# Patient Record
Sex: Male | Born: 1950
Health system: Southern US, Community
[De-identification: ages and names within clinical notes are randomized; demographics above are authoritative.]

## PROBLEM LIST (undated history)

## (undated) DIAGNOSIS — I10 Essential (primary) hypertension: Secondary | ICD-10-CM

## (undated) DIAGNOSIS — E7800 Pure hypercholesterolemia, unspecified: Secondary | ICD-10-CM

---

## 2009-12-26 ENCOUNTER — Ambulatory Visit: Payer: Self-pay | Admitting: Gastroenterology

## 2014-09-18 ENCOUNTER — Ambulatory Visit: Payer: Self-pay | Admitting: Gastroenterology

## 2014-11-20 LAB — SURGICAL PATHOLOGY

## 2016-10-03 ENCOUNTER — Emergency Department: Payer: Medicare Other

## 2016-10-03 ENCOUNTER — Encounter: Payer: Self-pay | Admitting: *Deleted

## 2016-10-03 DIAGNOSIS — I10 Essential (primary) hypertension: Secondary | ICD-10-CM | POA: Diagnosis not present

## 2016-10-03 DIAGNOSIS — R0789 Other chest pain: Secondary | ICD-10-CM | POA: Insufficient documentation

## 2016-10-03 DIAGNOSIS — R0781 Pleurodynia: Secondary | ICD-10-CM | POA: Diagnosis present

## 2016-10-03 LAB — BASIC METABOLIC PANEL
Anion gap: 8 (ref 5–15)
BUN: 12 mg/dL (ref 6–20)
CALCIUM: 9.7 mg/dL (ref 8.9–10.3)
CO2: 31 mmol/L (ref 22–32)
CREATININE: 0.92 mg/dL (ref 0.61–1.24)
Chloride: 97 mmol/L — ABNORMAL LOW (ref 101–111)
Glucose, Bld: 147 mg/dL — ABNORMAL HIGH (ref 65–99)
Potassium: 3.9 mmol/L (ref 3.5–5.1)
SODIUM: 136 mmol/L (ref 135–145)

## 2016-10-03 LAB — CBC
HCT: 43.3 % (ref 40.0–52.0)
Hemoglobin: 15.6 g/dL (ref 13.0–18.0)
MCH: 30.3 pg (ref 26.0–34.0)
MCHC: 35.9 g/dL (ref 32.0–36.0)
MCV: 84.2 fL (ref 80.0–100.0)
PLATELETS: 154 10*3/uL (ref 150–440)
RBC: 5.14 MIL/uL (ref 4.40–5.90)
RDW: 13.7 % (ref 11.5–14.5)
WBC: 6.8 10*3/uL (ref 3.8–10.6)

## 2016-10-03 LAB — TROPONIN I

## 2016-10-03 NOTE — ED Triage Notes (Signed)
PT arrived to ED reporting left sided chest pains beginning at 14:00 this afternoon. PT reports having taken Rolaids without relief and increased pain began at 16:00. PT denies SOB, weakness, dizziness, NVD, cough, fever. No increased tenderness upon palpation. Pt denies a heart hx other than HTN,  but reports he has not been to the doctor in 17 years. Pt is alert and oriented x 4 and in NAD at this time.

## 2016-10-04 ENCOUNTER — Emergency Department
Admission: EM | Admit: 2016-10-04 | Discharge: 2016-10-04 | Disposition: A | Discharge status: Home / Self Care | Payer: Medicare Other | Attending: Emergency Medicine | Admitting: Emergency Medicine

## 2016-10-04 DIAGNOSIS — R079 Chest pain, unspecified: Secondary | ICD-10-CM

## 2016-10-04 HISTORY — DX: Essential (primary) hypertension: I10

## 2016-10-04 LAB — HEPATIC FUNCTION PANEL
ALT: 37 U/L (ref 17–63)
AST: 37 U/L (ref 15–41)
Albumin: 4.4 g/dL (ref 3.5–5.0)
Alkaline Phosphatase: 47 U/L (ref 38–126)
BILIRUBIN DIRECT: 0.2 mg/dL (ref 0.1–0.5)
BILIRUBIN TOTAL: 1.2 mg/dL (ref 0.3–1.2)
Indirect Bilirubin: 1 mg/dL — ABNORMAL HIGH (ref 0.3–0.9)
Total Protein: 7.6 g/dL (ref 6.5–8.1)

## 2016-10-04 LAB — LIPASE, BLOOD: Lipase: 16 U/L (ref 11–51)

## 2016-10-04 LAB — TROPONIN I: Troponin I: <0.03 ng/mL (ref <0.03)

## 2016-10-04 MED ORDER — ASPIRIN 81 MG PO CHEW
324.0000 mg | CHEWABLE_TABLET | Freq: Once | ORAL | Status: AC
  Administered 2016-10-04: 324 mg via ORAL
  Filled 2016-10-04: qty 4

## 2016-10-04 NOTE — ED Notes (Signed)
Patient reports intermittent left chest pain beginning at 1400 10/03/16 that has since resolved. Pt denies radiation and accompanying symptoms. Pt reports he believes it may have been indigestion.

## 2016-10-04 NOTE — Discharge Instructions (Signed)
1. Start baby aspirin daily. °2. Return to the ER for worsening symptoms, persistent vomiting, difficulty breathing or other concerns. °

## 2016-10-04 NOTE — ED Provider Notes (Signed)
[facility] Emergency Department Provider Note   ____________________________________________   First MD Initiated Contact with Patient 10/04/16 0144     (approximate)  I have reviewed the triage vital signs and the nursing notes.   HISTORY  Chief Complaint Chest Pain    HPI Gregory Mathis is a 66 y.o. male who presents to the ED from home with a chief complaint of chest pain. Patient reports substernal and left-sided"funny feeling in chest" approximately 2 PM after eating 2 chocolate chip cookies. Pain subsided but then returned approximately 4 PM after patient ate 2 apples. Patient reports taking Rolaids without relief of symptoms. Does report belching a lot. Cannot characterize discomfort other than saying it "feels funny". Denies associated diaphoresis, shortness of breath, nausea, vomiting, palpitations or dizziness. Denies recent fever, chills, cough, congestion, abdominal pain, diarrhea. Denies recent travel, trauma or hormone use.   Past Medical History:  Diagnosis Date  . Hypertension     There are no active problems to display for this patient.   History reviewed. No pertinent surgical history.  Prior to Admission medications   Not on File    Allergies Patient has no allergy information on record.  Family history None for CAD  Social History Social History  Substance Use Topics  . Smoking status: Never Smoker  . Smokeless tobacco: Never Used  . Alcohol use No    Review of Systems  Constitutional: No fever/chills. Eyes: No visual changes. ENT: No sore throat. Cardiovascular: Positive for chest pain. Respiratory: Denies shortness of breath. Gastrointestinal: No abdominal pain.  No nausea, no vomiting.  No diarrhea.  No constipation. Genitourinary: Negative for dysuria. Musculoskeletal: Negative for back pain. Skin: Negative for rash. Neurological: Negative for headaches, focal weakness or numbness.  10-point ROS  otherwise negative.  ____________________________________________   PHYSICAL EXAM:  VITAL SIGNS: ED Triage Vitals  Enc Vitals Group     BP 10/03/16 2135 (!) 160/88     Pulse Rate 10/03/16 2135 85     Resp 10/03/16 2135 16     Temp 10/03/16 2135 97.6 F (36.4 C)     Temp Source 10/03/16 2135 Oral     SpO2 10/03/16 2135 97 %     Weight 10/03/16 2133 185 lb (83.9 kg)     Height 10/03/16 2133 5\' 10"  (1.778 m)     Head Circumference --      Peak Flow --      Pain Score 10/03/16 2133 4     Pain Loc --      Pain Edu? --      Excl. in GC? --     Constitutional: Alert and oriented. Well appearing and in no acute distress. Eyes: Conjunctivae are normal. PERRL. EOMI. Head: Atraumatic. Nose: No congestion/rhinnorhea. Mouth/Throat: Mucous membranes are moist.  Oropharynx non-erythematous. Neck: No stridor.   Cardiovascular: Normal rate, regular rhythm. Grossly normal heart sounds.  Good peripheral circulation. Respiratory: Normal respiratory effort.  No retractions. Lungs CTAB. Gastrointestinal: Soft and nontender. No distention. No abdominal bruits. No CVA tenderness. Musculoskeletal: No lower extremity tenderness nor edema.  No joint effusions. Neurologic:  Normal speech and language. No gross focal neurologic deficits are appreciated. No gait instability. Skin:  Skin is warm, dry and intact. No rash noted. Psychiatric: Mood and affect are normal. Speech and behavior are normal.  ____________________________________________   LABS (all labs ordered are listed, but only abnormal results are displayed)  Labs Reviewed  BASIC METABOLIC PANEL - Abnormal; Notable for the following:  Result Value   Chloride 97 (*)    Glucose, Bld 147 (*)    All other components within normal limits  HEPATIC FUNCTION PANEL - Abnormal; Notable for the following:    Indirect Bilirubin 1.0 (*)    All other components within normal limits  CBC  TROPONIN I  LIPASE, BLOOD  TROPONIN I    ____________________________________________  EKG  ED ECG REPORT I, Jezelle Gullick J, the attending physician, personally viewed and interpreted this ECG.   Date: 10/04/2016  EKG Time: 2136  Rate: 68  Rhythm: normal EKG, normal sinus rhythm  Axis: Normal  Intervals:none  ST&T Change: Nonspecific  ____________________________________________  RADIOLOGY  Chest x-ray interpreted per Dr. Karie KirksBloomer: Normal chest ____________________________________________   PROCEDURES  Procedure(s) performed: None  Procedures  Critical Care performed: No  ____________________________________________   INITIAL IMPRESSION / ASSESSMENT AND PLAN / ED COURSE  Pertinent labs & imaging results that were available during my care of the patient were reviewed by me and considered in my medical decision making (see chart for details).  66 year old male who presents with chest discomfort. Past medical history significant for hypertension and hyperlipidemia. Initial troponin and EKG are unremarkable. Will administer aspirin and obtain repeat, time to troponin. Low suspicion for acute ACS, PE or aortic dissection.  Clinical Course as of Oct 05 330  Sat Oct 04, 2016  0329 Updated patient and spouse of negative repeat troponin. Encourage patient to start daily baby aspirin and follow-up with cardiology on Monday. Strict return precautions given. Both verbalize understanding and agree with plan of care.  [JS]    Clinical Course User Index [JS] Irean HongJade J Eland Lamantia, MD     ____________________________________________   FINAL CLINICAL IMPRESSION(S) / ED DIAGNOSES  Final diagnoses:  Chest pain, unspecified type      NEW MEDICATIONS STARTED DURING THIS VISIT:  New Prescriptions   No medications on file     Note:  This document was prepared using Dragon voice recognition software and may include unintentional dictation errors.    Irean HongJade J Avyn Aden, MD 10/04/16 [phone]

## 2017-01-04 ENCOUNTER — Emergency Department
Admission: EM | Admit: 2017-01-04 | Discharge: 2017-01-04 | Disposition: A | Discharge status: Home / Self Care | Payer: Medicare HMO | Attending: Emergency Medicine | Admitting: Emergency Medicine

## 2017-01-04 ENCOUNTER — Encounter: Payer: Self-pay | Admitting: Emergency Medicine

## 2017-01-04 ENCOUNTER — Emergency Department: Payer: Medicare HMO

## 2017-01-04 DIAGNOSIS — R079 Chest pain, unspecified: Secondary | ICD-10-CM

## 2017-01-04 DIAGNOSIS — I10 Essential (primary) hypertension: Secondary | ICD-10-CM | POA: Insufficient documentation

## 2017-01-04 DIAGNOSIS — K30 Functional dyspepsia: Secondary | ICD-10-CM | POA: Insufficient documentation

## 2017-01-04 HISTORY — DX: Pure hypercholesterolemia, unspecified: E78.00

## 2017-01-04 LAB — BASIC METABOLIC PANEL
ANION GAP: 8 (ref 5–15)
BUN: 16 mg/dL (ref 6–20)
CHLORIDE: 98 mmol/L — AB (ref 101–111)
CO2: 30 mmol/L (ref 22–32)
Calcium: 10.1 mg/dL (ref 8.9–10.3)
Creatinine, Ser: 0.99 mg/dL (ref 0.61–1.24)
Glucose, Bld: 139 mg/dL — ABNORMAL HIGH (ref 65–99)
POTASSIUM: 3.8 mmol/L (ref 3.5–5.1)
SODIUM: 136 mmol/L (ref 135–145)

## 2017-01-04 LAB — CBC
HEMATOCRIT: 43.6 % (ref 40.0–52.0)
HEMOGLOBIN: 15.5 g/dL (ref 13.0–18.0)
MCH: 30 pg (ref 26.0–34.0)
MCHC: 35.5 g/dL (ref 32.0–36.0)
MCV: 84.3 fL (ref 80.0–100.0)
Platelets: 162 10*3/uL (ref 150–440)
RBC: 5.18 MIL/uL (ref 4.40–5.90)
RDW: 13.7 % (ref 11.5–14.5)
WBC: 6.4 10*3/uL (ref 3.8–10.6)

## 2017-01-04 LAB — TROPONIN I: Troponin I: <0.03 ng/mL (ref <0.03)

## 2017-01-04 NOTE — ED Notes (Signed)
Upon assessment pt reports feeling a burning sensation in his chest tonight and became worried. Pt denies any associated symptoms. Pt is alert and oriented and in no apparent distress.

## 2017-01-04 NOTE — ED Provider Notes (Signed)
[facility] Emergency Department Provider Note   ____________________________________________   First MD Initiated Contact with Patient 01/04/17 2327     (approximate)  I have reviewed the triage vital signs and the nursing notes.   HISTORY  Chief Complaint Chest Pain    HPI Gregory Mathis is a 66 y.o. male who comes into the hospital today believing that he had an episode of indigestion. He reports he had a similar episode 5 weeks ago and his heart was evaluated and it was determined that his symptoms were due to indigestion. The patient states that he had some excessive belching this afternoon. He reports that he also developed a pain in his chest. He said it went on for about a minute but it was so much that it scared him. The patient reports that the pain has eased off and he has not had any pain at this time. He had some Doritos before this started and last time he had a tomato. The patient denies any shortness of breath, sweats, nausea, vomiting. He did have some tingling in his legs which again made him nervous. The patient reports that the symptoms at this time are gone. He was concerned about a heart attack. The patient reports that he has a GI consult July 9 and after his last visit he saw his physician and wasencouraged that everything was fine. The patient is here today for evaluation.   Past Medical History:  Diagnosis Date  . Hypercholesteremia   . Hypertension     There are no active problems to display for this patient.   History reviewed. No pertinent surgical history.  Prior to Admission medications   Not on File    Allergies Patient has no known allergies.  History reviewed. No pertinent family history.  Social History Social History  Substance Use Topics  . Smoking status: Never Smoker  . Smokeless tobacco: Never Used  . Alcohol use No    Review of Systems  Constitutional: No fever/chills Eyes: No visual changes. ENT:  No sore throat. Cardiovascular:  chest pain. Respiratory: Denies shortness of breath. Gastrointestinal: belching, No abdominal pain.  No nausea, no vomiting.  No diarrhea.  No constipation. Genitourinary: Negative for dysuria. Musculoskeletal: Negative for back pain. Skin: Negative for rash. Neurological: Negative for headaches, focal weakness or numbness.   ____________________________________________   PHYSICAL EXAM:  VITAL SIGNS: ED Triage Vitals  Enc Vitals Group     BP 01/04/17 1637 140/77     Pulse Rate 01/04/17 1637 (!) 56     Resp 01/04/17 1637 16     Temp 01/04/17 1637 98.4 F (36.9 C)     Temp Source 01/04/17 1637 Oral     SpO2 01/04/17 1637 97 %     Weight 01/04/17 1635 185 lb (83.9 kg)     Height 01/04/17 1635 5\' 10"  (1.778 m)     Head Circumference --      Peak Flow --      Pain Score 01/04/17 1634 3     Pain Loc --      Pain Edu? --      Excl. in GC? --     Constitutional: Alert and oriented. Well appearing and in no acute distress. Eyes: Conjunctivae are normal. PERRL. EOMI. Head: Atraumatic. Nose: No congestion/rhinnorhea. Mouth/Throat: Mucous membranes are moist.  Oropharynx non-erythematous. Cardiovascular: Normal rate, regular rhythm. Grossly normal heart sounds.  Good peripheral circulation. Respiratory: Normal respiratory effort.  No retractions. Lungs CTAB. Gastrointestinal: Soft  and nontender. No distention. Positive bowel sounds Musculoskeletal: No lower extremity tenderness nor edema.   Neurologic:  Normal speech and language.  Skin:  Skin is warm, dry and intact.  Psychiatric: Mood and affect are normal. .  ____________________________________________   LABS (all labs ordered are listed, but only abnormal results are displayed)  Labs Reviewed  BASIC METABOLIC PANEL - Abnormal; Notable for the following:       Result Value   Chloride 98 (*)    Glucose, Bld 139 (*)    All other components within normal limits  CBC  TROPONIN I    TROPONIN I   ____________________________________________  EKG  ED ECG REPORT I, Rebecka ApleyWebster,  Dora Simeone P, the attending physician, personally viewed and interpreted this ECG.   Date: 01/04/2017  EKG Time: 1625  Rate: 63  Rhythm: normal sinus rhythm  Axis: normal  Intervals:none  ST&T Change: none  ____________________________________________  RADIOLOGY  Dg Chest 2 View  Result Date: 01/04/2017 CLINICAL DATA:  Center to left sided chest pain started at 1230p but has since resolved. Pt states it started as indigestion but worsened. Pt also reports burping a lot. Pt also states he was seen in March 2018 for similar symptoms but was diagn.*comment was truncated* EXAM: CHEST  2 VIEW COMPARISON:  10/03/2016 FINDINGS: Midline trachea.  Normal heart size and mediastinal contours. Sharp costophrenic angles.  No pneumothorax.  Clear lungs. IMPRESSION: No active cardiopulmonary disease. Electronically Signed   By: Jeronimo GreavesKyle  Talbot M.D.   On: 01/04/2017 17:13    ____________________________________________   PROCEDURES  Procedure(s) performed: None  Procedures  Critical Care performed: No  ____________________________________________   INITIAL IMPRESSION / ASSESSMENT AND PLAN / ED COURSE  Pertinent labs & imaging results that were available during my care of the patient were reviewed by me and considered in my medical decision making (see chart for details).  This is a 66 year old male who comes into the hospital today with some chest pain. He was having some indigestion and belching as well. The patient's blood work is unremarkable. He had to troponins which were negative and the remaining blood work and chest x-ray are also negative. The patient is no longer having any pain at this time. I will discharge the patient to home. He reports that he has a consultation with GI scheduled for July 9 in approximately one month and I will encourage him to follow-up with cardiology as well. The  patient will be discharged home.  Clinical Course as of Jan 05 2343  Sun Jan 04, 2017  2315 No active cardiopulmonary disease. DG Chest 2 View [AW]    Clinical Course User Index [AW] Rebecka ApleyWebster, Les Longmore P, MD     ____________________________________________   FINAL CLINICAL IMPRESSION(S) / ED DIAGNOSES  Final diagnoses:  Chest pain, unspecified type  Indigestion      NEW MEDICATIONS STARTED DURING THIS VISIT:  New Prescriptions   No medications on file     Note:  This document was prepared using Dragon voice recognition software and may include unintentional dictation errors.    Rebecka ApleyWebster, Thalia Turkington P, MD 01/04/17 [phone]

## 2017-01-04 NOTE — ED Triage Notes (Signed)
Pt c/o left sided intermittent chest pain today. Denies NVD, SHOB, fever.  Ambulatory to triage.  NAD. VSS.  Was seen here 5 weeks ago per pt and dx with indigestion and feels the same.

## 2017-01-04 NOTE — Discharge Instructions (Signed)
Please follow up with cardiology and your primary care physician

## 2017-01-05 ENCOUNTER — Telehealth: Payer: Self-pay

## 2017-01-05 NOTE — Telephone Encounter (Signed)
Spoke to patient wife and she stated patient is going to his general provider  They do not want a heart care follow up yet Pt was seen in ED on 01/04/17 for CP  Nothing further needed.

## 2017-12-08 ENCOUNTER — Encounter: Payer: Self-pay | Admitting: Emergency Medicine

## 2017-12-08 ENCOUNTER — Other Ambulatory Visit: Payer: Self-pay

## 2017-12-08 ENCOUNTER — Emergency Department: Payer: Medicare HMO

## 2017-12-08 ENCOUNTER — Emergency Department
Admission: EM | Admit: 2017-12-08 | Discharge: 2017-12-08 | Disposition: A | Discharge status: Home / Self Care | Payer: Medicare HMO | Attending: Emergency Medicine | Admitting: Emergency Medicine

## 2017-12-08 DIAGNOSIS — K8020 Calculus of gallbladder without cholecystitis without obstruction: Secondary | ICD-10-CM | POA: Diagnosis not present

## 2017-12-08 DIAGNOSIS — R945 Abnormal results of liver function studies: Secondary | ICD-10-CM | POA: Diagnosis not present

## 2017-12-08 DIAGNOSIS — I10 Essential (primary) hypertension: Secondary | ICD-10-CM | POA: Insufficient documentation

## 2017-12-08 DIAGNOSIS — R7989 Other specified abnormal findings of blood chemistry: Secondary | ICD-10-CM

## 2017-12-08 DIAGNOSIS — R509 Fever, unspecified: Secondary | ICD-10-CM | POA: Diagnosis present

## 2017-12-08 LAB — BASIC METABOLIC PANEL
ANION GAP: 12 (ref 5–15)
BUN: 17 mg/dL (ref 6–20)
CHLORIDE: 93 mmol/L — AB (ref 101–111)
CO2: 26 mmol/L (ref 22–32)
CREATININE: 0.87 mg/dL (ref 0.61–1.24)
Calcium: 9.3 mg/dL (ref 8.9–10.3)
GFR calc non Af Amer: >60 mL/min (ref >60)
Glucose, Bld: 207 mg/dL — ABNORMAL HIGH (ref 65–99)
POTASSIUM: 3.4 mmol/L — AB (ref 3.5–5.1)
SODIUM: 131 mmol/L — AB (ref 135–145)

## 2017-12-08 LAB — CBC WITH DIFFERENTIAL/PLATELET
Basophils Absolute: 0 10*3/uL (ref 0–0.1)
Basophils Relative: 0 %
EOS ABS: 0.1 10*3/uL (ref 0–0.7)
Eosinophils Relative: 1 %
HCT: 41.2 % (ref 40.0–52.0)
HEMOGLOBIN: 14.5 g/dL (ref 13.0–18.0)
LYMPHS PCT: 6 %
Lymphs Abs: 0.3 10*3/uL — ABNORMAL LOW (ref 1.0–3.6)
MCH: 29.8 pg (ref 26.0–34.0)
MCHC: 35.2 g/dL (ref 32.0–36.0)
MCV: 84.6 fL (ref 80.0–100.0)
Monocytes Absolute: 0.3 10*3/uL (ref 0.2–1.0)
Monocytes Relative: 6 %
NEUTROS PCT: 87 %
Neutro Abs: 4.5 10*3/uL (ref 1.4–6.5)
Platelets: 119 10*3/uL — ABNORMAL LOW (ref 150–440)
RBC: 4.87 MIL/uL (ref 4.40–5.90)
RDW: 14 % (ref 11.5–14.5)
WBC: 5.2 10*3/uL (ref 3.8–10.6)

## 2017-12-08 LAB — HEPATIC FUNCTION PANEL
ALBUMIN: 3.6 g/dL (ref 3.5–5.0)
ALT: 110 U/L — ABNORMAL HIGH (ref 17–63)
AST: 92 U/L — AB (ref 15–41)
Alkaline Phosphatase: 109 U/L (ref 38–126)
Bilirubin, Direct: 2.9 mg/dL — ABNORMAL HIGH (ref 0.1–0.5)
Indirect Bilirubin: 2 mg/dL — ABNORMAL HIGH (ref 0.3–0.9)
Total Bilirubin: 4.9 mg/dL — ABNORMAL HIGH (ref 0.3–1.2)
Total Protein: 7.1 g/dL (ref 6.5–8.1)

## 2017-12-08 LAB — PROTIME-INR
INR: 1.08
PROTHROMBIN TIME: 13.9 s (ref 11.4–15.2)

## 2017-12-08 LAB — LIPASE, BLOOD: Lipase: 50 U/L (ref 11–51)

## 2017-12-08 MED ORDER — IOPAMIDOL (ISOVUE-300) INJECTION 61%
30.0000 mL | Freq: Once | INTRAVENOUS | Status: AC | PRN
  Administered 2017-12-08: 30 mL via ORAL

## 2017-12-08 MED ORDER — IOPAMIDOL (ISOVUE-370) INJECTION 76%
75.0000 mL | Freq: Once | INTRAVENOUS | Status: AC | PRN
  Administered 2017-12-08: 75 mL via INTRAVENOUS

## 2017-12-08 MED ORDER — ONDANSETRON 4 MG PO TBDP: 4.0000 mg | ORAL_TABLET | Freq: Three times a day (TID) | ORAL | 0 refills | Status: AC | PRN

## 2017-12-08 NOTE — ED Notes (Signed)
Pt presentation discussed with Dr. Scotty Court.  See new orders.

## 2017-12-08 NOTE — ED Triage Notes (Signed)
Pt in via POV, seen by PCP yesterday due to fever, cold chills, emesis since Sunday.  Pt received call back today from PCP, advised to be evaluated here due to "something about his liver."  Pt A/Ox4, vitals WDL, NAD noted at this time.

## 2017-12-08 NOTE — Discharge Instructions (Addendum)
Your CT scan today is unremarkable except for finding gallstones in your gallbladder.  There are no signs of infection of the gallbladder. Follow up with general surgery for further evaluation of your gallstones.

## 2017-12-08 NOTE — ED Provider Notes (Signed)
[facility] Emergency Department Provider Note  ____________________________________________  Time seen: Approximately 7:30 PM  I have reviewed the triage vital signs and the nursing notes.   HISTORY  Chief Complaint Fever    HPI Gregory Mathis is a 67 y.o. male sent to the ED by his doctor due to abnormal liver function tests. Patient reports that 3 days ago he was having epigastric pain nausea and vomiting after eating dinner. He also had subjective fever and chills at that time. The pain resolved on its own after a few hours, since then he has not had much appetite. No persistent fever. Pain was severe and nonradiating. No alleviating factors. He is not having postprandial symptoms since then. Denies chest pain or shortness of breath or syncope.  Denies any history of pancreatitis, gallstones, liver disease. Not a drinker or smoker.      Past Medical History:  Diagnosis Date  . Hypercholesteremia   . Hypertension      There are no active problems to display for this patient.    History reviewed. No pertinent surgical history.   Prior to Admission medications   Medication Sig Start Date End Date Taking? Authorizing Provider  ondansetron (ZOFRAN ODT) 4 MG disintegrating tablet Take 1 tablet (4 mg total) by mouth every 8 (eight) hours as needed for nausea or vomiting. 12/08/17   Sharman Cheek, MD     Allergies Patient has no known allergies.   No family history on file.  Social History Social History   Tobacco Use  . Smoking status: Never Smoker  . Smokeless tobacco: Never Used  Substance Use Topics  . Alcohol use: No  . Drug use: No    Review of Systems  Constitutional:   No fever or chills.  ENT:   No sore throat. No rhinorrhea. Cardiovascular:   No chest pain or syncope. Respiratory:   No dyspnea or cough. Gastrointestinal:   positive as above for abdominal pain and vomiting. No constipation or diarrhea  Musculoskeletal:    Negative for focal pain or swelling All other systems reviewed and are negative except as documented above in ROS and HPI.  ____________________________________________   PHYSICAL EXAM:  VITAL SIGNS: ED Triage Vitals  Enc Vitals Group     BP 12/08/17 1511 134/76     Pulse Rate 12/08/17 1511 78     Resp 12/08/17 1511 16     Temp 12/08/17 1511 99 F (37.2 C)     Temp Source 12/08/17 1511 Oral     SpO2 12/08/17 1511 96 %     Weight 12/08/17 1512 178 lb (80.7 kg)     Height 12/08/17 1512  (1.778 m)     Head Circumference --      Peak Flow --      Pain Score 12/08/17 1512 0     Pain Loc --      Pain Edu? --      Excl. in GC? --     Vital signs reviewed, nursing assessments reviewed.   Constitutional:   Alert and oriented. Well appearing and in no distress. Eyes:   mild scleral icterus. EOMI. PERRL. ENT      Head:   Normocephalic and atraumatic.      Nose:   No congestion/rhinnorhea.       Mouth/Throat:   MMM, no pharyngeal erythema. No peritonsillar mass.       Neck:   No meningismus. Full ROM. Hematological/Lymphatic/Immunilogical:   No cervical lymphadenopathy. Cardiovascular:  RRR. Symmetric bilateral radial and DP pulses.  No murmurs.  Respiratory:   Normal respiratory effort without tachypnea/retractions. Breath sounds are clear and equal bilaterally. No wheezes/rales/rhonchi. Gastrointestinal:   Soft and nontender. Non distended. There is no CVA tenderness.  No rebound, rigidity, or guarding.  Musculoskeletal:   Normal range of motion in all extremities. No joint effusions.  No lower extremity tenderness.  No edema. Neurologic:   Normal speech and language.  Motor grossly intact. No acute focal neurologic deficits are appreciated.  Skin:    Skin is warm, dry and intact. No rash noted.  No petechiae, purpura, or bullae.  ____________________________________________    LABS (pertinent positives/negatives) (all labs ordered are listed, but only abnormal  results are displayed) Labs Reviewed  HEPATIC FUNCTION PANEL - Abnormal; Notable for the following components:      Result Value   AST 92 (*)    ALT 110 (*)    Total Bilirubin 4.9 (*)    Bilirubin, Direct 2.9 (*)    Indirect Bilirubin 2.0 (*)    All other components within normal limits  BASIC METABOLIC PANEL - Abnormal; Notable for the following components:   Sodium 131 (*)    Potassium 3.4 (*)    Chloride 93 (*)    Glucose, Bld 207 (*)    All other components within normal limits  CBC WITH DIFFERENTIAL/PLATELET - Abnormal; Notable for the following components:   Platelets 119 (*)    Lymphs Abs 0.3 (*)    All other components within normal limits  LIPASE, BLOOD  PROTIME-INR   ____________________________________________   EKG    ____________________________________________    RADIOLOGY  Ct Abdomen Pelvis W Contrast  Result Date: 12/08/2017 CLINICAL DATA:  Fever, chills and vomiting since 12/06/2017. EXAM: CT ABDOMEN AND PELVIS WITH CONTRAST TECHNIQUE: Multidetector CT imaging of the abdomen and pelvis was performed using the standard protocol following bolus administration of intravenous contrast. CONTRAST:  75 ml ISOVUE-370 IOPAMIDOL (ISOVUE-370) INJECTION 76% COMPARISON:  None. FINDINGS: Lower chest: Lung bases are clear. No pleural or pericardial effusion. Hepatobiliary: The liver is low attenuating consistent with fatty infiltration. No focal lesion is identified. The liver measures 19 cm craniocaudal. Multiple tiny stones are seen in the gallbladder but there is no evidence of cholecystitis. Biliary tree appears normal. Pancreas: Unremarkable. No pancreatic ductal dilatation or surrounding inflammatory changes. Spleen: The spleen measures 15 cm craniocaudal.  No focal lesion. Adrenals/Urinary Tract: Adrenal glands appear normal. Single small bilateral renal cysts are seen. The kidneys are otherwise normal in appearance. Urinary bladder appears normal. Stomach/Bowel: Stomach  is within normal limits. Appendix appears normal. No evidence of bowel wall thickening, distention, or inflammatory changes. Vascular/Lymphatic: Aortic atherosclerosis. No enlarged abdominal or pelvic lymph nodes. Reproductive: Prostate is unremarkable. Other: Small fat containing inguinal hernias are seen bilaterally. No free or focal fluid collection in the abdomen or pelvis. Musculoskeletal: No acute or focal bony abnormality. IMPRESSION: No acute abnormality abdomen or pelvis. Fatty infiltration of the liver and hepatomegaly. Splenomegaly. Multiple tiny gallstones without evidence of cholecystitis. Atherosclerosis. Small bilateral fat containing inguinal hernias. Electronically Signed   By: Drusilla Kanner M.D.   On: 12/08/2017 20:01    ____________________________________________   PROCEDURES Procedures  ____________________________________________  DIFFERENTIAL DIAGNOSIS   pancreatic mass, passed gallstone  CLINICAL IMPRESSION / ASSESSMENT AND PLAN / ED COURSE  Pertinent labs & imaging results that were available during my care of the patient were reviewed by me and considered in my medical decision making (see chart  for details).    patient presents with elevated liver function tests after an episode of upper abdominal pain nausea vomiting and subjective fevers that occurred 3 days ago and has since resolved. Most likely this represents a passed gallstone. Since he is now nontender with normal vital signs and LFTs are improved today compared to yesterday when reviewing the electronic medical record, this appears to be a self-limited episode that can be followed up on an outpatient basis. However, because of age I will obtain a CT scan of the abdomen and pelvis to evaluate for pancreatic mass due to the current presentation of painless jaundice. If negative and the patient is suitable for discharge home. Presentation is not consistent with AAA, cholecystitis, cholangitis, bowel perforation  or obstruction. I doubt ACS PE or dissection.      ----------------------------------------- 9:21 PM on 12/08/2017 -----------------------------------------  CT scan unremarkable, shows cholelithiasis. Consistent with most likely expiration of a recently passed gallstone. No evidence of biliary obstruction or pancreatic mass, suitable for outpatient follow-up.  ____________________________________________   FINAL CLINICAL IMPRESSION(S) / ED DIAGNOSES    Final diagnoses:  Elevated LFTs  Calculus of gallbladder without cholecystitis without obstruction     ED Discharge Orders        Ordered    ondansetron (ZOFRAN ODT) 4 MG disintegrating tablet  Every 8 hours PRN     12/08/17 2120      Portions of this note were generated with dragon dictation software. Dictation errors may occur despite best attempts at proofreading.    Sharman Cheek, MD 12/08/17 2122

## 2017-12-09 ENCOUNTER — Telehealth: Payer: Self-pay | Admitting: General Practice

## 2017-12-09 NOTE — Telephone Encounter (Signed)
Spoke with patient's wife said her husband was going to see his pcp in the morning and would call us after he spoke with his doctor.

## 2017-12-09 NOTE — Telephone Encounter (Signed)
-----   Message from Nicole Kindred sent at 12/09/2017 11:08 AM EDT ----- Regarding: ED referral Please place patient on schedule-not urgent-Elevated LFTs Calculus of gallbladder without cholecystitis without obstruction   New patient.

## 2017-12-15 NOTE — Telephone Encounter (Signed)
Unable to leave a message for the patient to call the office, patient has no voicemail setup.

## 2017-12-29 NOTE — Telephone Encounter (Signed)
Spoke with patients spouse said her husband has another appointment with another surgeon that patients primary care doctor referred him too.

## 2019-11-30 IMAGING — CT CT ABD-PELV W/ CM
2 of 5 series · 16 of 46 positions shown, 18 images · IV contrast (iopamidol)
Comparison: None.

CLINICAL DATA: Fever, chills and vomiting since 12/06/2017.

EXAM:
CT ABDOMEN AND PELVIS WITH CONTRAST
TECHNIQUE: Multidetector CT imaging of the abdomen and pelvis was performed
using the standard protocol following bolus administration of
intravenous contrast.
CONTRAST:  75 ml BGG9HF-DGT IOPAMIDOL (BGG9HF-DGT) INJECTION 76%

[Series 2: routine abd/pel with · axial · 0.72mm/px · z∈[-540,-110]mm · 13 of 98 slices shown, 15 images]
[im 6/98  soft-tissue]
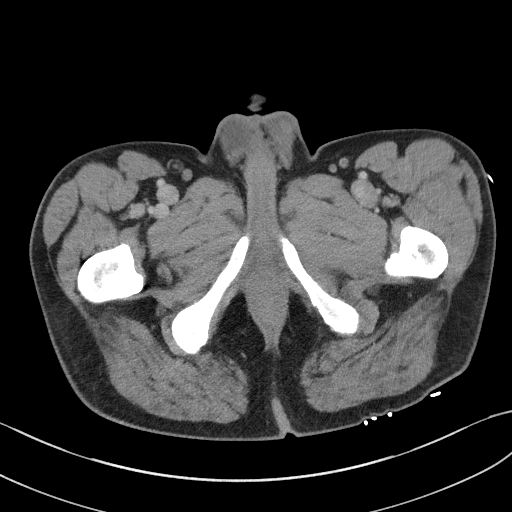
[im 6/98  bone]
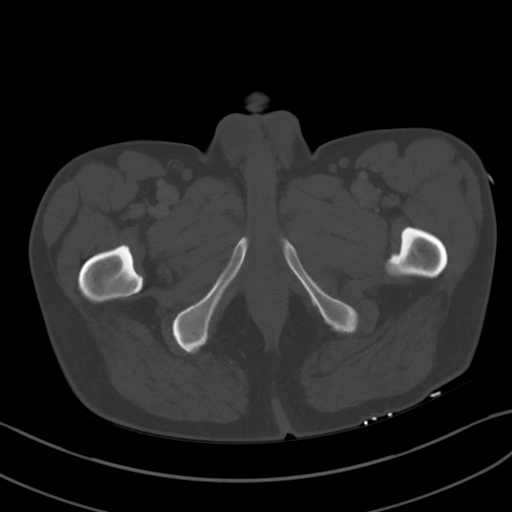
[im 16/98  soft-tissue]
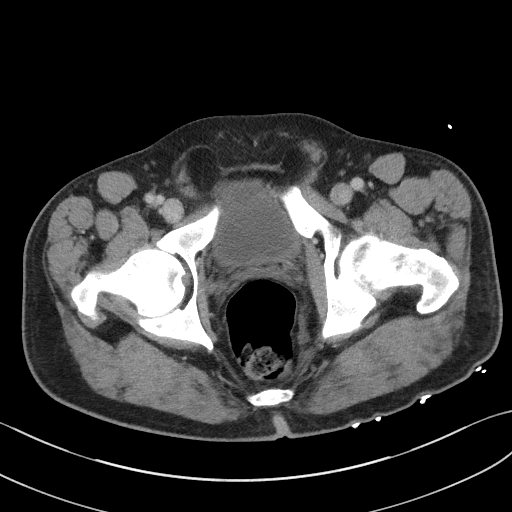
[im 21/98  soft-tissue]
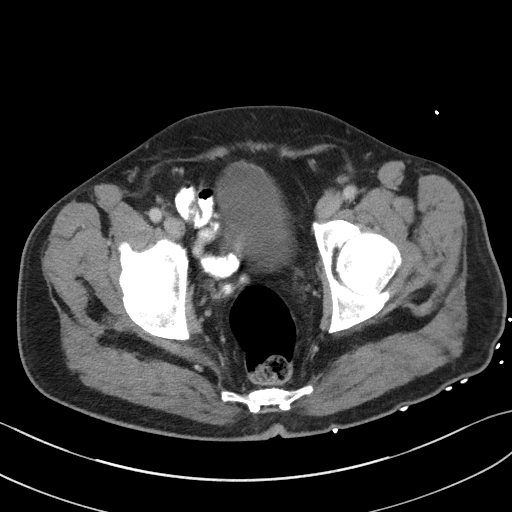
[im 26/98  soft-tissue]
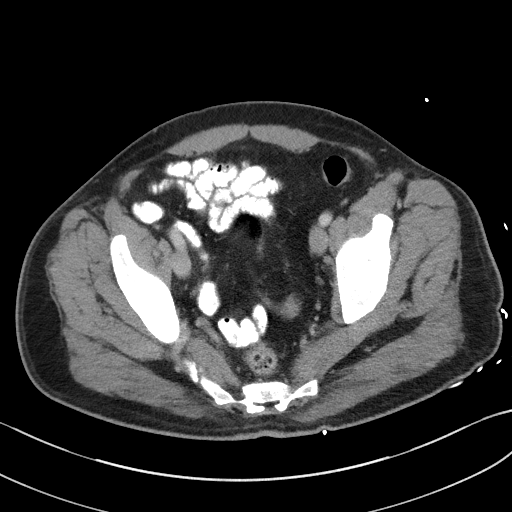
[im 36/98  soft-tissue]
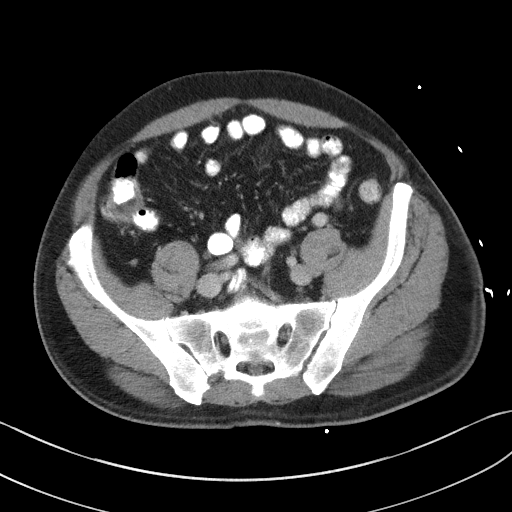
[im 41/98  soft-tissue]
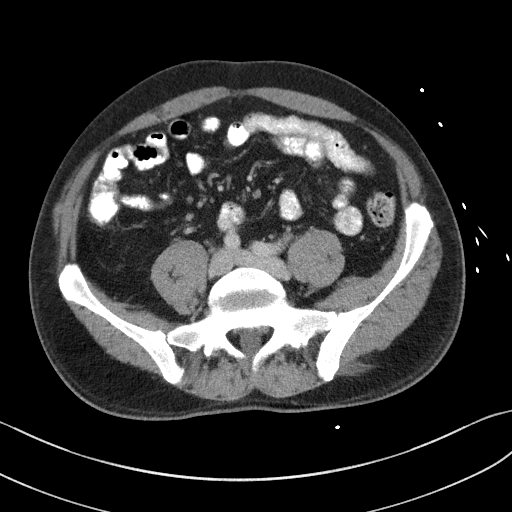
[im 52/98  soft-tissue]
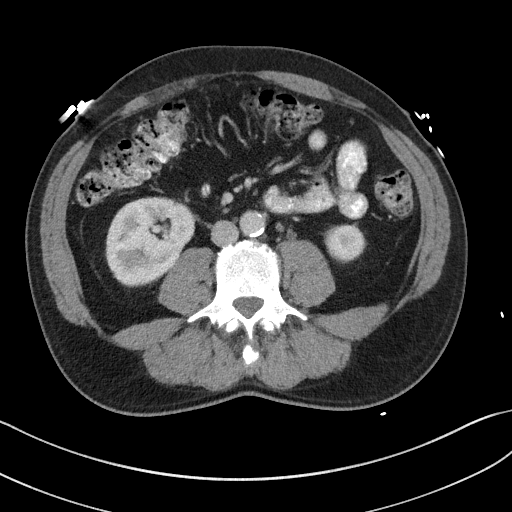
[im 57/98  soft-tissue]
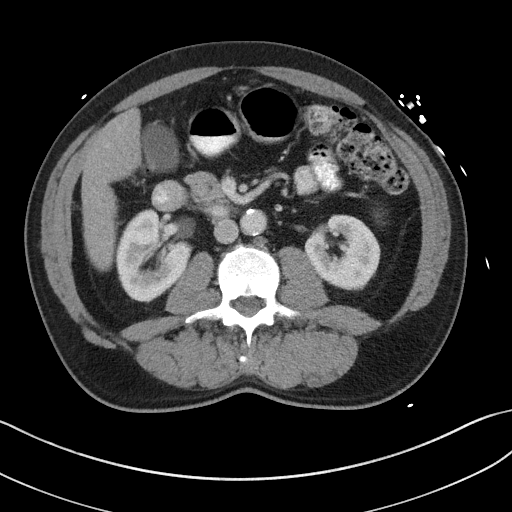
[im 62/98  soft-tissue]
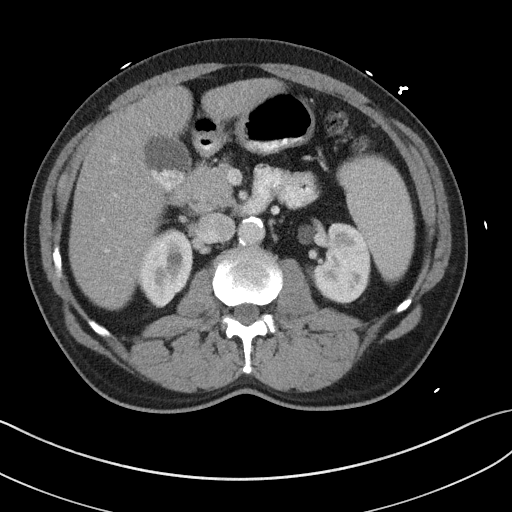
[im 62/98  bone]
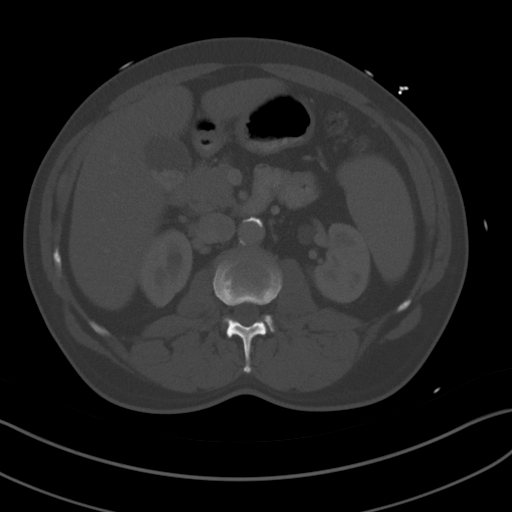
[im 72/98  soft-tissue]
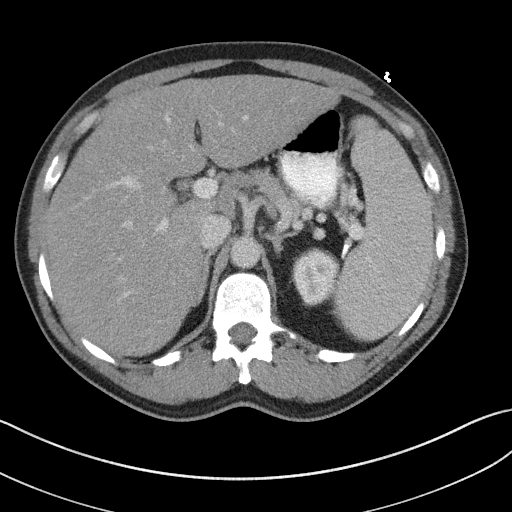
[im 77/98  soft-tissue]
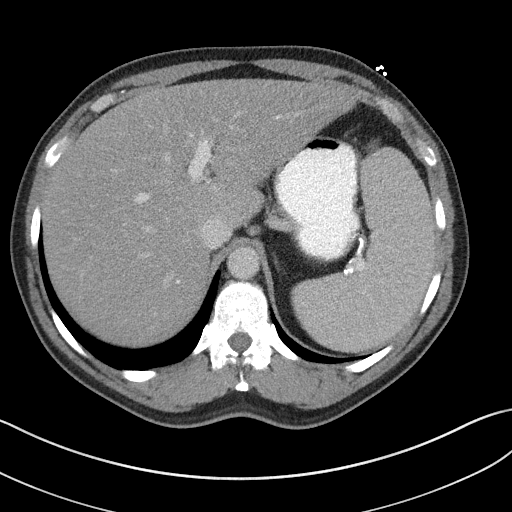
[im 82/98  soft-tissue]
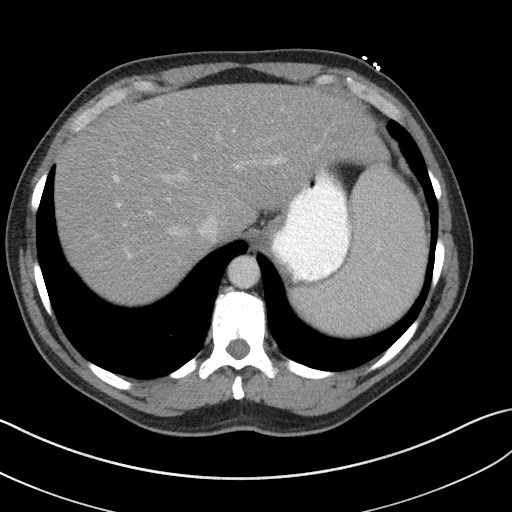
[im 92/98  soft-tissue]
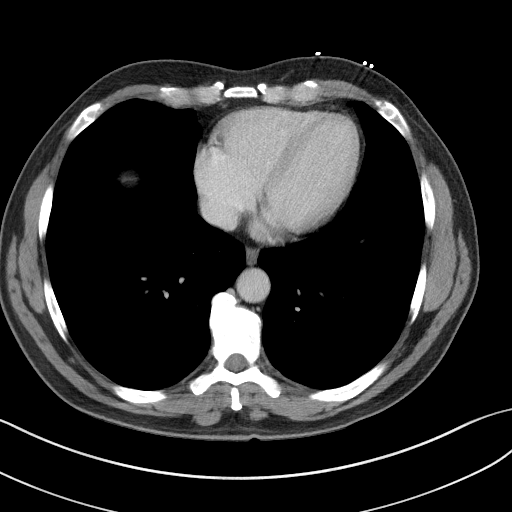

[Series 5: coronal st · coronal · 0.70mm/px · 3 of 91 slices shown]
[im 31/91  soft-tissue]
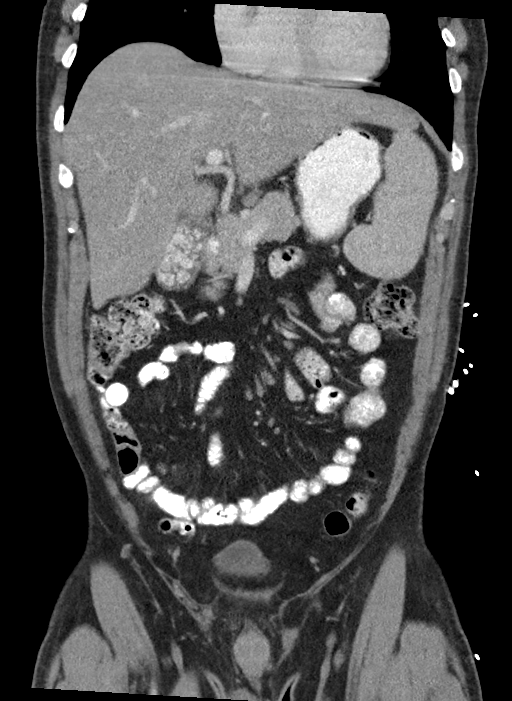
[im 41/91  soft-tissue]
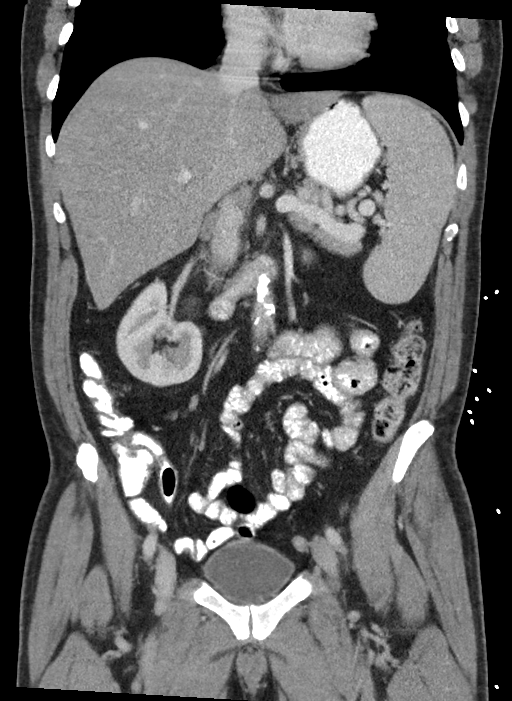
[im 51/91  soft-tissue]
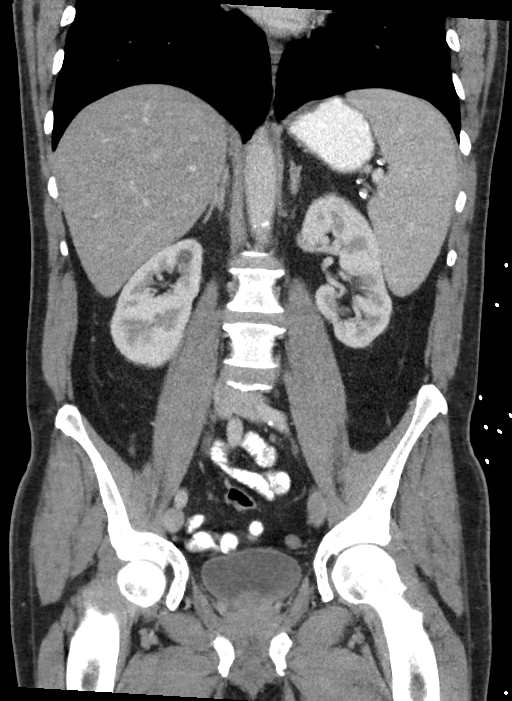

[16 of 46 positions shown; findings below may reference images not displayed]

FINDINGS: Lower chest: Lung bases are clear. No pleural or pericardial
effusion.

Hepatobiliary: The liver is low attenuating consistent with fatty
infiltration. No focal lesion is identified. The liver measures 19
cm craniocaudal. Multiple tiny stones are seen in the gallbladder
but there is no evidence of cholecystitis. Biliary tree appears
normal.

Pancreas: Unremarkable. No pancreatic ductal dilatation or
surrounding inflammatory changes.

Spleen: The spleen measures 15 cm craniocaudal.  No focal lesion.

Adrenals/Urinary Tract: Adrenal glands appear normal. Single small
bilateral renal cysts are seen. The kidneys are otherwise normal in
appearance. Urinary bladder appears normal.

Stomach/Bowel: Stomach is within normal limits. Appendix appears
normal. No evidence of bowel wall thickening, distention, or
inflammatory changes.

Vascular/Lymphatic: Aortic atherosclerosis. No enlarged abdominal or
pelvic lymph nodes.

Reproductive: Prostate is unremarkable.

Other: Small fat containing inguinal hernias are seen bilaterally.
No free or focal fluid collection in the abdomen or pelvis.

Musculoskeletal: No acute or focal bony abnormality.
IMPRESSION: No acute abnormality abdomen or pelvis.

Fatty infiltration of the liver and hepatomegaly.

Splenomegaly.

Multiple tiny gallstones without evidence of cholecystitis.

Atherosclerosis.

Small bilateral fat containing inguinal hernias.

## 2020-01-17 ENCOUNTER — Other Ambulatory Visit: Payer: Self-pay

## 2020-01-17 ENCOUNTER — Encounter: Payer: Self-pay | Admitting: Emergency Medicine

## 2020-01-17 ENCOUNTER — Ambulatory Visit
Admission: EM | Admit: 2020-01-17 | Discharge: 2020-01-17 | Disposition: A | Discharge status: Home / Self Care | Payer: Medicare HMO | Attending: Family Medicine | Admitting: Family Medicine

## 2020-01-17 ENCOUNTER — Ambulatory Visit: Admit: 2020-01-17 | Discharge status: Absent without leave | Payer: Self-pay | Source: Home / Self Care

## 2020-01-17 DIAGNOSIS — K219 Gastro-esophageal reflux disease without esophagitis: Secondary | ICD-10-CM | POA: Diagnosis not present

## 2020-01-17 DIAGNOSIS — K8020 Calculus of gallbladder without cholecystitis without obstruction: Secondary | ICD-10-CM | POA: Diagnosis present

## 2020-01-17 LAB — CBC WITH DIFFERENTIAL/PLATELET
Abs Immature Granulocytes: 0.02 10*3/uL (ref 0.00–0.07)
Basophils Absolute: 0 10*3/uL (ref 0.0–0.1)
Basophils Relative: 0 %
Eosinophils Absolute: 0.2 10*3/uL (ref 0.0–0.5)
Eosinophils Relative: 3 %
HCT: 44 % (ref 39.0–52.0)
Hemoglobin: 16 g/dL (ref 13.0–17.0)
Immature Granulocytes: 0 %
Lymphocytes Relative: 20 %
Lymphs Abs: 1.3 10*3/uL (ref 0.7–4.0)
MCH: 30.5 pg (ref 26.0–34.0)
MCHC: 36.4 g/dL — ABNORMAL HIGH (ref 30.0–36.0)
MCV: 84 fL (ref 80.0–100.0)
Monocytes Absolute: 0.5 10*3/uL (ref 0.1–1.0)
Monocytes Relative: 7 %
Neutro Abs: 4.8 10*3/uL (ref 1.7–7.7)
Neutrophils Relative %: 70 %
Platelets: 179 10*3/uL (ref 150–400)
RBC: 5.24 MIL/uL (ref 4.22–5.81)
RDW: 13.8 % (ref 11.5–15.5)
WBC: 6.8 10*3/uL (ref 4.0–10.5)
nRBC: 0 % (ref 0.0–0.2)

## 2020-01-17 LAB — COMPREHENSIVE METABOLIC PANEL
ALT: 32 U/L (ref 0–44)
AST: 34 U/L (ref 15–41)
Albumin: 4.4 g/dL (ref 3.5–5.0)
Alkaline Phosphatase: 51 U/L (ref 38–126)
Anion gap: 10 (ref 5–15)
BUN: 14 mg/dL (ref 8–23)
CO2: 31 mmol/L (ref 22–32)
Calcium: 9.7 mg/dL (ref 8.9–10.3)
Chloride: 96 mmol/L — ABNORMAL LOW (ref 98–111)
Creatinine, Ser: 0.94 mg/dL (ref 0.61–1.24)
GFR calc Af Amer: >60 mL/min (ref >60)
GFR calc non Af Amer: >60 mL/min (ref >60)
Glucose, Bld: 155 mg/dL — ABNORMAL HIGH (ref 70–99)
Potassium: 4 mmol/L (ref 3.5–5.1)
Sodium: 137 mmol/L (ref 135–145)
Total Bilirubin: 0.7 mg/dL (ref 0.3–1.2)
Total Protein: 7.8 g/dL (ref 6.5–8.1)

## 2020-01-17 LAB — LIPASE, BLOOD: Lipase: 35 U/L (ref 11–51)

## 2020-01-17 LAB — TROPONIN I (HIGH SENSITIVITY): Troponin I (High Sensitivity): 3 ng/L (ref <18)

## 2020-01-17 MED ORDER — PANTOPRAZOLE SODIUM 40 MG PO TBEC: 40.0000 mg | DELAYED_RELEASE_TABLET | Freq: Two times a day (BID) | ORAL | 1 refills | Status: AC

## 2020-01-17 NOTE — Discharge Instructions (Signed)
Stop prilosec.   Start Protonix.  Have PCP send you back to GI. Discuss cholecystectomy with surgeon.  Take care  Dr. Adriana Simas

## 2020-01-17 NOTE — ED Provider Notes (Addendum)
[facility]    CSN: [id number] Arrival date & time: 01/17/20  1236  History   Chief Complaint Chief Complaint  Patient presents with  . Gastroesophageal Reflux  . Chest Pain   HPI  69 year old male presents with the above complaints.  Patient reports ongoing heartburn and belching.  States that it seems to have been worse since the weekend.  He reports that he has had times where it felt funny in his chest.  He denies pain at this time.  Denies abdominal pain.  No shortness of breath.  Patient has known GERD.  He has previously seen GI.  He most recently saw his primary care provider yesterday.  Note was reviewed.  Patient was advised to continue Prilosec and to use famotidine in addition to if needed.  Patient reports that the medication does not seem to be helping his symptoms.  Seems to be worse after he eats particularly things like chocolate.  Seems also to be associated with fatty meals.  Patient has known gallstones.  He was previously recommended to have a cholecystectomy but has not went through with it.  No other associated symptoms.  No other complaints.  Past Medical History:  Diagnosis Date  . Hypercholesteremia   . Hypertension    Home Medications    Prior to Admission medications   Medication Sig Start Date End Date Taking? Authorizing Provider  albuterol (VENTOLIN HFA) 108 (90 Base) MCG/ACT inhaler Inhale into the lungs. 07/09/18  Yes [provider]  lisinopril-hydrochlorothiazide (ZESTORETIC) 20-25 MG tablet Take 1 tablet by mouth daily. 04/18/19  Yes [provider]  omeprazole (PRILOSEC) 40 MG capsule Take by mouth. 04/18/19  Yes [provider]  simvastatin (ZOCOR) 20 MG tablet Take by mouth. 04/18/19  Yes [provider]  gabapentin (NEURONTIN) 300 MG capsule Take 300 mg by mouth 3 (three) times daily. 12/13/19   [provider]  ondansetron (ZOFRAN ODT) 4 MG disintegrating tablet Take 1 tablet (4 mg total) by  mouth every 8 (eight) hours as needed for nausea or vomiting. 12/08/17   Sharman Cheek, MD  pantoprazole (PROTONIX) 40 MG tablet Take 1 tablet (40 mg total) by mouth 2 (two) times daily before a meal. 01/17/20   Tommie Sams, DO    Family History Family History  Problem Relation Age of Onset  . Healthy Mother     Social History Social History   Tobacco Use  . Smoking status: Never Smoker  . Smokeless tobacco: Never Used  Vaping Use  . Vaping Use: Never used  Substance Use Topics  . Alcohol use: No  . Drug use: No     Allergies   Patient has no known allergies.   Review of Systems Review of Systems  Constitutional: Negative for fever.  Gastrointestinal: Negative for abdominal pain.       Belching.    Physical Exam Triage Vital Signs ED Triage Vitals  Enc Vitals Group     BP 01/17/20 1240 (!) 156/82     Pulse Rate 01/17/20 1240 74     Resp 01/17/20 1240 18     Temp 01/17/20 1240 98.2 F (36.8 C)     Temp Source 01/17/20 1240 Oral     SpO2 01/17/20 1240 99 %     Weight 01/17/20 1241 178 lb (80.7 kg)     Height 01/17/20 1241 5\' 11"  (1.803 m)     Head Circumference --      Peak Flow --  Pain Score 01/17/20 1241 0     Pain Loc --      Pain Edu? --      Excl. in [place]? --    No data found.  Updated Vital Signs BP (!) 156/82 (BP Location: Right Arm)   Pulse 74   Temp 98.2 F (36.8 C) (Oral)   Resp 18   Ht 5\' 11"  (1.803 m)   Wt 80.7 kg   SpO2 99%   BMI 24.83 kg/m   Visual Acuity Right Eye Distance:   Left Eye Distance:   Bilateral Distance:    Right Eye Near:   Left Eye Near:    Bilateral Near:     Physical Exam   UC Treatments / Results  Labs (all labs ordered are listed, but only abnormal results are displayed) Labs Reviewed  CBC WITH DIFFERENTIAL/PLATELET - Abnormal; Notable for the following components:      Result Value   MCHC 36.4 (*)    All other components within normal limits  COMPREHENSIVE METABOLIC PANEL - Abnormal; Notable  for the following components:   Chloride 96 (*)    Glucose, Bld 155 (*)    All other components within normal limits  LIPASE, BLOOD  TROPONIN I (HIGH SENSITIVITY)    EKG Interpretation: Normal sinus rhythm with rate of 67.  Normal axis.  Normal intervals.  No ST or T wave changes.  Normal EKG.  Radiology No results found.  Procedures Procedures (including critical care time)  Medications Ordered in UC Medications - No data to display  Initial Impression / Assessment and Plan / UC Course  I have reviewed the triage vital signs and the nursing notes.  Pertinent labs & imaging results that were available during my care of the patient were reviewed by me and considered in my medical decision making (see chart for details).    69 year old male presents with ongoing belching/burping which is particularly postprandial.  Patient has known GERD as well as gallstones.  EKG unremarkable.  Laboratory studies notable for elevated glucose of 155.  Otherwise unremarkable.  Troponin negative.  Stopping Prilosec.  Placing on Protonix.  Advised to see his primary regarding referral to GI for endoscopy.  Recommend cholecystectomy as well as he has been previously recommended for this.  Final Clinical Impressions(s) / UC Diagnoses   Final diagnoses:  Gastroesophageal reflux disease without esophagitis  Gallstones     Discharge Instructions     Stop prilosec.   Start Protonix.  Have PCP send you back to GI. Discuss cholecystectomy with surgeon.  Take care  Dr. Lacinda Axon    ED Prescriptions    Medication Sig Dispense Auth. Provider   pantoprazole (PROTONIX) 40 MG tablet Take 1 tablet (40 mg total) by mouth 2 (two) times daily before a meal. 60 tablet Thersa Salt G, DO     PDMP not reviewed this encounter.     Coral Spikes, [place] 01/17/20 1431

## 2020-01-17 NOTE — ED Triage Notes (Signed)
Patient c/o acid reflux that started Saturday. He states he has no appetite because is unsure of what to eat that won't give him pain. Patient states his chest "feels funny." Reports sweating last night. Denies back pain, denies jaw pain, denies shoulder or arm pain.

## 2020-02-08 ENCOUNTER — Other Ambulatory Visit: Payer: Self-pay | Admitting: Family Medicine

## 2021-03-23 ENCOUNTER — Ambulatory Visit: Admit: 2021-03-23 | Discharge status: Against Medical Advice | Payer: Self-pay

## 2021-07-01 ENCOUNTER — Ambulatory Visit: Admit: 2021-07-01 | Discharge status: Absent without leave | Payer: Self-pay

## 2021-07-01 ENCOUNTER — Emergency Department: Payer: Medicare HMO

## 2021-07-01 ENCOUNTER — Encounter: Payer: Self-pay | Admitting: Radiology

## 2021-07-01 DIAGNOSIS — R079 Chest pain, unspecified: Secondary | ICD-10-CM | POA: Diagnosis present

## 2021-07-01 DIAGNOSIS — R059 Cough, unspecified: Secondary | ICD-10-CM | POA: Diagnosis not present

## 2021-07-01 DIAGNOSIS — Z20822 Contact with and (suspected) exposure to covid-19: Secondary | ICD-10-CM | POA: Insufficient documentation

## 2021-07-01 DIAGNOSIS — Z5321 Procedure and treatment not carried out due to patient leaving prior to being seen by health care provider: Secondary | ICD-10-CM | POA: Insufficient documentation

## 2021-07-01 LAB — CBC WITH DIFFERENTIAL/PLATELET
Abs Immature Granulocytes: 0.02 10*3/uL (ref 0.00–0.07)
Basophils Absolute: 0 10*3/uL (ref 0.0–0.1)
Basophils Relative: 1 %
Eosinophils Absolute: 0.5 10*3/uL (ref 0.0–0.5)
Eosinophils Relative: 7 %
HCT: 43.5 % (ref 39.0–52.0)
Hemoglobin: 15.9 g/dL (ref 13.0–17.0)
Immature Granulocytes: 0 %
Lymphocytes Relative: 25 %
Lymphs Abs: 1.6 10*3/uL (ref 0.7–4.0)
MCH: 30.9 pg (ref 26.0–34.0)
MCHC: 36.6 g/dL — ABNORMAL HIGH (ref 30.0–36.0)
MCV: 84.5 fL (ref 80.0–100.0)
Monocytes Absolute: 0.5 10*3/uL (ref 0.1–1.0)
Monocytes Relative: 8 %
Neutro Abs: 3.9 10*3/uL (ref 1.7–7.7)
Neutrophils Relative %: 59 %
Platelets: 167 10*3/uL (ref 150–400)
RBC: 5.15 MIL/uL (ref 4.22–5.81)
RDW: 13.3 % (ref 11.5–15.5)
WBC: 6.6 10*3/uL (ref 4.0–10.5)
nRBC: 0 % (ref 0.0–0.2)

## 2021-07-01 LAB — RESP PANEL BY RT-PCR (FLU A&B, COVID) ARPGX2
Influenza A by PCR: NEGATIVE
Influenza B by PCR: NEGATIVE
SARS Coronavirus 2 by RT PCR: NEGATIVE

## 2021-07-01 LAB — COMPREHENSIVE METABOLIC PANEL
ALT: 29 U/L (ref 0–44)
AST: 31 U/L (ref 15–41)
Albumin: 4.4 g/dL (ref 3.5–5.0)
Alkaline Phosphatase: 55 U/L (ref 38–126)
Anion gap: 7 (ref 5–15)
BUN: 14 mg/dL (ref 8–23)
CO2: 27 mmol/L (ref 22–32)
Calcium: 9.5 mg/dL (ref 8.9–10.3)
Chloride: 101 mmol/L (ref 98–111)
Creatinine, Ser: 0.72 mg/dL (ref 0.61–1.24)
GFR, Estimated: >60 mL/min (ref >60)
Glucose, Bld: 165 mg/dL — ABNORMAL HIGH (ref 70–99)
Potassium: 3.7 mmol/L (ref 3.5–5.1)
Sodium: 135 mmol/L (ref 135–145)
Total Bilirubin: 0.9 mg/dL (ref 0.3–1.2)
Total Protein: 7.5 g/dL (ref 6.5–8.1)

## 2021-07-01 NOTE — ED Triage Notes (Signed)
Pt presents from home via POV with a complaint of CP and a cough for 3-4 weeks that has progressively gotten worse. Pt was prescribed tessalon medication & a z-pack that has not improved his sx. Respirations are unlabored and equal.

## 2021-07-02 ENCOUNTER — Emergency Department
Admission: EM | Admit: 2021-07-02 | Discharge: 2021-07-02 | Disposition: A | Discharge status: Home / Self Care | Payer: Medicare HMO | Attending: Emergency Medicine | Admitting: Emergency Medicine

## 2021-09-06 ENCOUNTER — Ambulatory Visit
Admission: EM | Admit: 2021-09-06 | Discharge: 2021-09-06 | Disposition: A | Discharge status: Home / Self Care | Payer: Medicare HMO | Attending: Emergency Medicine | Admitting: Emergency Medicine

## 2021-09-06 ENCOUNTER — Other Ambulatory Visit: Payer: Self-pay

## 2021-09-06 DIAGNOSIS — U071 COVID-19: Secondary | ICD-10-CM | POA: Diagnosis not present

## 2021-09-06 MED ORDER — BENZONATATE 100 MG PO CAPS: 200.0000 mg | ORAL_CAPSULE | Freq: Three times a day (TID) | ORAL | 0 refills | Status: DC

## 2021-09-06 MED ORDER — MOLNUPIRAVIR EUA 200MG CAPSULE: 4.0000 | ORAL_CAPSULE | Freq: Two times a day (BID) | ORAL | 0 refills | Status: AC

## 2021-09-06 NOTE — Discharge Instructions (Signed)
You will have to quarantine for 5 days from the start of your symptoms.  After 5 days you can break quarantine if your symptoms have improved and you have not had a fever for 24 hours without taking Tylenol or ibuprofen.  Use over-the-counter Tylenol and ibuprofen as needed for body aches and fever.  Use the Tessalon Perles during the day as needed for cough and use over-the-counter Delsym or Robitussin-DM as needed for cough symptoms.  Take the molnupiravir twice daily for 5 days for treatment of COVID-19.  If you develop any increased shortness of breath-especially at rest, you are unable to speak in full sentences, or is a late sign your lips are turning blue you need to go the ER for evaluation.

## 2021-09-06 NOTE — ED Provider Notes (Signed)
[facility]    CSN: [id number] Arrival date & time: 09/06/21  8676      History   Chief Complaint Chief Complaint  Patient presents with   Fatigue   Nausea   COVID19 Exposure    HPI Gregory Mathis is a 71 y.o. male.   HPI  71 year old male here for evaluation of nausea and cough after being exposed to COVID.  Patient reports his symptoms started yesterday after he got home from work.  He states he is also had some fatigue.  His cough is nonproductive and he only reports nausea but no vomiting or diarrhea.  Patient also denies fever, runny nose nasal congestion, sore throat, headache, body aches, shortness breath, or wheezing.  Patient has a past medical history of high blood pressure, high cholesterol, and GERD.  Past Medical History:  Diagnosis Date   Hypercholesteremia    Hypertension     There are no problems to display for this patient.   History reviewed. No pertinent surgical history.     Home Medications    Prior to Admission medications   Medication Sig Start Date End Date Taking? Authorizing Provider  benzonatate (TESSALON) 100 MG capsule Take 2 capsules (200 mg total) by mouth every 8 (eight) hours. 09/06/21  Yes Becky Augusta, NP  gabapentin (NEURONTIN) 300 MG capsule Take 300 mg by mouth 3 (three) times daily. 12/13/19  Yes [provider]  lisinopril-hydrochlorothiazide (ZESTORETIC) 20-25 MG tablet Take 1 tablet by mouth daily. 04/18/19  Yes [provider]  molnupiravir EUA (LAGEVRIO) 200 mg CAPS capsule Take 4 capsules (800 mg total) by mouth 2 (two) times daily for 5 days. 09/06/21 09/11/21 Yes Becky Augusta, NP  omeprazole (PRILOSEC) 40 MG capsule Take by mouth. 04/18/19  Yes [provider]  simvastatin (ZOCOR) 20 MG tablet Take by mouth. 04/18/19  Yes [provider]  albuterol (VENTOLIN HFA) 108 (90 Base) MCG/ACT inhaler Inhale into the lungs. 07/09/18   [provider]  ondansetron (ZOFRAN ODT) 4  MG disintegrating tablet Take 1 tablet (4 mg total) by mouth every 8 (eight) hours as needed for nausea or vomiting. 12/08/17   Sharman Cheek, MD  pantoprazole (PROTONIX) 40 MG tablet Take 1 tablet (40 mg total) by mouth 2 (two) times daily before a meal. 01/17/20   Tommie Sams, DO    Family History Family History  Problem Relation Age of Onset   Healthy Mother     Social History Social History   Tobacco Use   Smoking status: Never   Smokeless tobacco: Never  Vaping Use   Vaping Use: Never used  Substance Use Topics   Alcohol use: No   Drug use: No     Allergies   Patient has no known allergies.   Review of Systems Review of Systems  Constitutional:  Positive for fatigue. Negative for activity change, appetite change and fever.  HENT:  Negative for congestion, ear pain, rhinorrhea and sore throat.   Respiratory:  Positive for cough. Negative for shortness of breath and wheezing.   Gastrointestinal:  Positive for nausea. Negative for abdominal pain, diarrhea and vomiting.  Musculoskeletal:  Negative for arthralgias and myalgias.  Skin:  Negative for rash.  Neurological:  Negative for headaches.  Hematological: Negative.   Psychiatric/Behavioral: Negative.      Physical Exam Triage Vital Signs ED Triage Vitals  Enc Vitals Group     BP 09/06/21 0831 135/84     Pulse Rate 09/06/21 0831 77  Resp 09/06/21 0831 18     Temp 09/06/21 0831 99.2 F (37.3 C)     Temp Source 09/06/21 0831 Oral     SpO2 09/06/21 0831 98 %     Weight 09/06/21 0832 186 lb 1.1 oz (84.4 kg)     Height 09/06/21 0832 5\' 10"  (1.778 m)     Head Circumference --      Peak Flow --      Pain Score 09/06/21 0831 0     Pain Loc --      Pain Edu? --      Excl. in [place]? --    No data found.  Updated Vital Signs BP 135/84    Pulse 77    Temp 99.2 F (37.3 C) (Oral)    Resp 18    Ht 5\' 10"  (1.778 m)    Wt 186 lb 1.1 oz (84.4 kg)    SpO2 98%    BMI 26.70 kg/m   Visual Acuity Right Eye  Distance:   Left Eye Distance:   Bilateral Distance:    Right Eye Near:   Left Eye Near:    Bilateral Near:     Physical Exam Vitals and nursing note reviewed.  Constitutional:      General: He is not in acute distress.    Appearance: Normal appearance. He is not ill-appearing.  HENT:     Head: Normocephalic and atraumatic.     Right Ear: Tympanic membrane, ear canal and external ear normal. There is no impacted cerumen.     Left Ear: Tympanic membrane, ear canal and external ear normal. There is no impacted cerumen.     Ears:     Comments: Canals are narrow and moderately ceruminous.  The posterior aspect of the tympanic membrane's are visible bilaterally and are pearly gray in appearance.    Nose: Congestion present. No rhinorrhea.     Mouth/Throat:     Mouth: Mucous membranes are moist.     Pharynx: Oropharynx is clear. No posterior oropharyngeal erythema.  Cardiovascular:     Rate and Rhythm: Normal rate and regular rhythm.     Pulses: Normal pulses.     Heart sounds: Normal heart sounds. No murmur heard.   No friction rub. No gallop.  Pulmonary:     Effort: Pulmonary effort is normal.     Breath sounds: Normal breath sounds. No wheezing, rhonchi or rales.  Musculoskeletal:     Cervical back: Normal range of motion and neck supple.  Lymphadenopathy:     Cervical: No cervical adenopathy.  Skin:    General: Skin is warm and dry.     Capillary Refill: Capillary refill takes less than 2 seconds.     Findings: No erythema or rash.  Neurological:     General: No focal deficit present.     Mental Status: He is alert and oriented to person, place, and time.  Psychiatric:        Mood and Affect: Mood normal.        Behavior: Behavior normal.        Thought Content: Thought content normal.        Judgment: Judgment normal.     UC Treatments / Results  Labs (all labs ordered are listed, but only abnormal results are displayed) Labs Reviewed - No data to  display  EKG   Radiology No results found.  Procedures Procedures (including critical care time)  Medications Ordered in UC Medications - No data to display  Initial Impression / Assessment and Plan / UC Course  I have reviewed the triage vital signs and the nursing notes.  Pertinent labs & imaging results that were available during my care of the patient were reviewed by me and considered in my medical decision making (see chart for details).  Patient is a nontoxic-appearing 71 year old male here for evaluation of a nonproductive cough, fatigue, and mild nausea that started yesterday.  He was recently around his granddaughter who tested positive for COVID on 09/05/2021.  He is in no acute distress.  On physical exam patient is a very narrow external auditory canals that are moderately ceruminous.  I am able to visualize the posterior aspect of both tympanic membranes which are pearly gray in appearance.  Patient is no pain with movement of the auricle of the ear.  Nasal mucosa is erythematous with mild edema.  No significant rhinorrhea noted on exam.  Oropharyngeal exam is benign.  No cervical lymphadenopathy appreciated on exam.  Cardiopulmonary exam feels clear lung sounds in all fields.  Patient is here with his wife who has similar symptoms though she has run a fever.  She was swabbed for COVID.  If the wife is positive I will treat the patient with molnupiravir.  Patient's spouse tested positive for COVID-19.  Therefore, I will discharge patient home on molnupiravir twice daily for 5 days for treatment of COVID-19.  I will also give Tessalon Perles to help with his cough and he can use over-the-counter Delsym or Robitussin-DM as needed for cough at home.  Tylenol and ibuprofen as needed for fever and pain.  ER and return precautions reviewed.  Work note provided.   Final Clinical Impressions(s) / UC Diagnoses   Final diagnoses:  COVID-19     Discharge Instructions      You will  have to quarantine for 5 days from the start of your symptoms.  After 5 days you can break quarantine if your symptoms have improved and you have not had a fever for 24 hours without taking Tylenol or ibuprofen.  Use over-the-counter Tylenol and ibuprofen as needed for body aches and fever.  Use the Tessalon Perles during the day as needed for cough and use over-the-counter Delsym or Robitussin-DM as needed for cough symptoms.  Take the molnupiravir twice daily for 5 days for treatment of COVID-19.  If you develop any increased shortness of breath-especially at rest, you are unable to speak in full sentences, or is a late sign your lips are turning blue you need to go the ER for evaluation.      ED Prescriptions     Medication Sig Dispense Auth. Provider   molnupiravir EUA (LAGEVRIO) 200 mg CAPS capsule Take 4 capsules (800 mg total) by mouth 2 (two) times daily for 5 days. 40 capsule Becky Augusta, NP   benzonatate (TESSALON) 100 MG capsule Take 2 capsules (200 mg total) by mouth every 8 (eight) hours. 21 capsule Becky Augusta, NP      PDMP not reviewed this encounter.   Becky Augusta, NP 09/06/21 [phone]

## 2021-09-06 NOTE — ED Triage Notes (Signed)
Patient is here with Spouse today due to "some cough". Nausea "no vomiting". No fever "known". Exposure to COVID19. Granddaughter + for COVID19 in UC here [phone].

## 2022-02-22 ENCOUNTER — Ambulatory Visit
Admission: EM | Admit: 2022-02-22 | Discharge: 2022-02-22 | Disposition: A | Discharge status: Home / Self Care | Payer: Medicare HMO | Attending: Emergency Medicine | Admitting: Emergency Medicine

## 2022-02-22 ENCOUNTER — Encounter: Payer: Self-pay | Admitting: Emergency Medicine

## 2022-02-22 DIAGNOSIS — L089 Local infection of the skin and subcutaneous tissue, unspecified: Secondary | ICD-10-CM | POA: Diagnosis not present

## 2022-02-22 DIAGNOSIS — W57XXXA Bitten or stung by nonvenomous insect and other nonvenomous arthropods, initial encounter: Secondary | ICD-10-CM

## 2022-02-22 DIAGNOSIS — T07XXXA Unspecified multiple injuries, initial encounter: Secondary | ICD-10-CM | POA: Diagnosis not present

## 2022-02-22 MED ORDER — DOXYCYCLINE HYCLATE 100 MG PO CAPS: 100.0000 mg | ORAL_CAPSULE | Freq: Two times a day (BID) | ORAL | 0 refills | Status: DC

## 2022-02-22 NOTE — Discharge Instructions (Signed)
Take the Doxycycline twice daily with food for 10 days.  Doxycycline will make you more sensitive to sunburn so wear sunscreen when outdoors and reapply it every 90 minutes.  Apply warm compresses to help promote drainage.  Use OTC Tylenol and Ibuprofen according to the package instructions as needed for pain.  Return for new or worsening symptoms such as increased redness, swelling, red streaks, or fevers.

## 2022-02-22 NOTE — ED Triage Notes (Signed)
Patient noticed red bumps on his right and left lower legs that started on Wed.  Patient states that the area was itchy but not now.

## 2022-02-22 NOTE — ED Provider Notes (Signed)
[facility]    CSN: [id number] Arrival date & time: 02/22/22  1501      History   Chief Complaint Chief Complaint  Patient presents with   Rash    HPI Gregory Mathis is a 71 y.o. male.   HPI  71 year old male here for evaluation of skin rash.  Patient ports that he first noticed the rash on Wednesday and consist of red dots on both of his lower legs that go from ankle to just below his knee.  He states that initially they were itchy but that has resolved.  He is here because he is concerned that the dots are becoming darker in color.  He denies any pain, drainage, or fever.  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  One of the other workers noticed some red ants and he thinks he may have been bitten by them.  Past Medical History:  Diagnosis Date   Hypercholesteremia    Hypertension     There are no problems to display for this patient.   History reviewed. No pertinent surgical history.     Home Medications    Prior to Admission medications   Medication Sig Start Date End Date Taking? Authorizing Provider  doxycycline (VIBRAMYCIN) 100 MG capsule Take 1 capsule (100 mg total) by mouth 2 (two) times daily. 02/22/22  Yes Becky Augusta, NP  gabapentin (NEURONTIN) 300 MG capsule Take 300 mg by mouth 3 (three) times daily. 12/13/19  Yes [provider]  lisinopril-hydrochlorothiazide (ZESTORETIC) 20-25 MG tablet Take 1 tablet by mouth daily. 04/18/19  Yes [provider]  omeprazole (PRILOSEC) 40 MG capsule Take by mouth. 04/18/19  Yes [provider]  pantoprazole (PROTONIX) 40 MG tablet Take 1 tablet (40 mg total) by mouth 2 (two) times daily before a meal. 01/17/20  Yes Cook, Jayce G, DO  simvastatin (ZOCOR) 20 MG tablet Take by mouth. 04/18/19  Yes [provider]  albuterol (VENTOLIN HFA) 108 (90 Base) MCG/ACT inhaler Inhale into  the lungs. 07/09/18   [provider]  ondansetron (ZOFRAN ODT) 4 MG disintegrating tablet Take 1 tablet (4 mg total) by mouth every 8 (eight) hours as needed for nausea or vomiting. 12/08/17   Sharman Cheek, MD    Family History Family History  Problem Relation Age of Onset   Healthy Mother     Social History Social History   Tobacco Use   Smoking status: Never   Smokeless tobacco: Never  Vaping Use   Vaping Use: Never used  Substance Use Topics   Alcohol use: No   Drug use: No     Allergies   Patient has no known allergies.   Review of Systems Review of Systems  Constitutional:  Negative for fever.  Skin:  Positive for color change and rash.     Physical Exam Triage Vital Signs ED Triage Vitals  Enc Vitals Group     BP 02/22/22 1512 (!) 147/87     Pulse Rate 02/22/22 1512 75     Resp 02/22/22 1512 15     Temp 02/22/22 1512 98.3 F (36.8 C)     Temp Source 02/22/22 1512 Oral     SpO2 02/22/22 1512 100 %     Weight 02/22/22 1510 176 lb (79.8 kg)     Height 02/22/22 1510 5\' 10"  (1.778 m)     Head Circumference --  Peak Flow --      Pain Score 02/22/22 1510 0     Pain Loc --      Pain Edu? --      Excl. in [place]? --    No data found.  Updated Vital Signs BP (!) 147/87 (BP Location: Left Arm)   Pulse 75   Temp 98.3 F (36.8 C) (Oral)   Resp 15   Ht 5\' 10"  (1.778 m)   Wt 176 lb (79.8 kg)   SpO2 100%   BMI 25.25 kg/m   Visual Acuity Right Eye Distance:   Left Eye Distance:   Bilateral Distance:    Right Eye Near:   Left Eye Near:    Bilateral Near:     Physical Exam Vitals and nursing note reviewed.  Constitutional:      Appearance: Normal appearance. He is not ill-appearing.  HENT:     Head: Normocephalic and atraumatic.  Skin:    General: Skin is warm and dry.     Capillary Refill: Capillary refill takes less than 2 seconds.     Findings: Erythema and rash present.  Neurological:     General: No focal deficit present.      Mental Status: He is alert and oriented to person, place, and time.  Psychiatric:        Mood and Affect: Mood normal.        Behavior: Behavior normal.        Thought Content: Thought content normal.        Judgment: Judgment normal.      UC Treatments / Results  Labs (all labs ordered are listed, but only abnormal results are displayed) Labs Reviewed - No data to display  EKG   Radiology No results found.  Procedures Procedures (including critical care time)  Medications Ordered in UC Medications - No data to display  Initial Impression / Assessment and Plan / UC Course  I have reviewed the triage vital signs and the nursing notes.  Pertinent labs & imaging results that were available during my care of the patient were reviewed by me and considered in my medical decision making (see chart for details).  Patient is a very pleasant, nontoxic-appearing 71 year old male here for evaluation of a red rash that is on both of his lower legs.  Initially the rash that itched but is not itching anymore.  He denies any pain or drainage as well.  Also no fevers.  The rash consists of erythematous maculopapular lesions that are in different areas and circumferential around both lower legs.  They do not surround any hair follicles and there is no vesicles or pustules noted.  Some of the lesions are hot to touch but there is no red streaks, induration, or fluctuance noted.  They do appear to be insect bites.  Given that some of them are starting to become hot and darker in color I am concerned that he may be developing some mild cellulitis.  I will treat the patient with doxycycline twice daily for 10 days for the cellulitis.  I have advised patient that if the redness increases, he develops any swelling, drainage, red streaks up his leg, or fever he should return for reevaluation.   Final Clinical Impressions(s) / UC Diagnoses   Final diagnoses:  Infected insect bites of multiple sites      Discharge Instructions      Take the Doxycycline twice daily with food for 10 days.  Doxycycline will make you more  sensitive to sunburn so wear sunscreen when outdoors and reapply it every 90 minutes.  Apply warm compresses to help promote drainage.  Use OTC Tylenol and Ibuprofen according to the package instructions as needed for pain.  Return for new or worsening symptoms such as increased redness, swelling, red streaks, or fevers.     ED Prescriptions     Medication Sig Dispense Auth. Provider   doxycycline (VIBRAMYCIN) 100 MG capsule Take 1 capsule (100 mg total) by mouth 2 (two) times daily. 20 capsule Becky Augusta, NP      PDMP not reviewed this encounter.   Becky Augusta, NP 02/22/22 1550

## 2022-12-20 ENCOUNTER — Ambulatory Visit
Admission: EM | Admit: 2022-12-20 | Discharge: 2022-12-20 | Disposition: A | Discharge status: Home / Self Care | Payer: Medicare HMO | Attending: Physician Assistant | Admitting: Physician Assistant

## 2022-12-20 ENCOUNTER — Encounter: Payer: Self-pay | Admitting: Emergency Medicine

## 2022-12-20 DIAGNOSIS — K219 Gastro-esophageal reflux disease without esophagitis: Secondary | ICD-10-CM | POA: Insufficient documentation

## 2022-12-20 DIAGNOSIS — R079 Chest pain, unspecified: Secondary | ICD-10-CM

## 2022-12-20 LAB — COMPREHENSIVE METABOLIC PANEL
ALT: 29 U/L (ref 0–44)
AST: 35 U/L (ref 15–41)
Albumin: 4.4 g/dL (ref 3.5–5.0)
Alkaline Phosphatase: 51 U/L (ref 38–126)
Anion gap: 9 (ref 5–15)
BUN: 12 mg/dL (ref 8–23)
CO2: 27 mmol/L (ref 22–32)
Calcium: 8.9 mg/dL (ref 8.9–10.3)
Chloride: 98 mmol/L (ref 98–111)
Creatinine, Ser: 0.83 mg/dL (ref 0.61–1.24)
GFR, Estimated: >60 mL/min (ref >60)
Glucose, Bld: 139 mg/dL — ABNORMAL HIGH (ref 70–99)
Potassium: 3.6 mmol/L (ref 3.5–5.1)
Sodium: 134 mmol/L — ABNORMAL LOW (ref 135–145)
Total Bilirubin: 0.9 mg/dL (ref 0.3–1.2)
Total Protein: 7.5 g/dL (ref 6.5–8.1)

## 2022-12-20 LAB — CBC WITH DIFFERENTIAL/PLATELET
Abs Immature Granulocytes: 0.02 10*3/uL (ref 0.00–0.07)
Basophils Absolute: 0 10*3/uL (ref 0.0–0.1)
Basophils Relative: 0 %
Eosinophils Absolute: 0.3 10*3/uL (ref 0.0–0.5)
Eosinophils Relative: 4 %
HCT: 42.2 % (ref 39.0–52.0)
Hemoglobin: 15.5 g/dL (ref 13.0–17.0)
Immature Granulocytes: 0 %
Lymphocytes Relative: 21 %
Lymphs Abs: 1.4 10*3/uL (ref 0.7–4.0)
MCH: 30.3 pg (ref 26.0–34.0)
MCHC: 36.7 g/dL — ABNORMAL HIGH (ref 30.0–36.0)
MCV: 82.4 fL (ref 80.0–100.0)
Monocytes Absolute: 0.4 10*3/uL (ref 0.1–1.0)
Monocytes Relative: 6 %
Neutro Abs: 4.7 10*3/uL (ref 1.7–7.7)
Neutrophils Relative %: 69 %
Platelets: 148 10*3/uL — ABNORMAL LOW (ref 150–400)
RBC: 5.12 MIL/uL (ref 4.22–5.81)
RDW: 13.7 % (ref 11.5–15.5)
WBC: 6.8 10*3/uL (ref 4.0–10.5)
nRBC: 0 % (ref 0.0–0.2)

## 2022-12-20 LAB — GLUCOSE, CAPILLARY: Glucose-Capillary: 136 mg/dL — ABNORMAL HIGH (ref 70–99)

## 2022-12-20 LAB — TROPONIN I (HIGH SENSITIVITY): Troponin I (High Sensitivity): 3 ng/L (ref <18)

## 2022-12-20 LAB — LIPASE, BLOOD: Lipase: 45 U/L (ref 11–51)

## 2022-12-20 MED ORDER — FAMOTIDINE 20 MG PO TABS: 20.0000 mg | ORAL_TABLET | Freq: Two times a day (BID) | ORAL | 0 refills | Status: AC

## 2022-12-20 MED ORDER — ALUM & MAG HYDROXIDE-SIMETH 200-200-20 MG/5ML PO SUSP
30.0000 mL | Freq: Once | ORAL | Status: AC
  Administered 2022-12-20: 30 mL via ORAL

## 2022-12-20 MED ORDER — LIDOCAINE VISCOUS HCL 2 % MT SOLN
15.0000 mL | Freq: Once | OROMUCOSAL | Status: AC
  Administered 2022-12-20: 15 mL via OROMUCOSAL

## 2022-12-20 MED ORDER — SUCRALFATE 1 GM/10ML PO SUSP: 1.0000 g | Freq: Three times a day (TID) | ORAL | 0 refills | Status: AC | PRN

## 2022-12-20 NOTE — ED Provider Notes (Signed)
[facility]    CSN: [id number] Arrival date & time: 12/20/22  1421      History   Chief Complaint Chief Complaint  Patient presents with   Chest Pain   Dizziness    HPI Gregory Mathis is a 72 y.o. male with history of hypertension, hyperlipidemia, prediabetes, GERD.  Patient presents today for 8-hour history of intermittent central chest discomfort which she describes as a "funny feeling" like "indigestion."  Denies radiation of discomfort to neck, arm, back or abdomen.  No nausea or vomiting.  Reports limited fatigue.  Dizziness upon standing over the past 2 to 3 hours.  He reports a lot of increased belching and burping.  Denies palpitations, leg swelling, weakness, presyncope/syncope, headache, vision changes, numbness/tingling.  Reports taking pantoprazole 40 mg capsules daily for GERD.  Says it only works.  Reports that he did have problems with his dinner last night and thinks that could have flared up his reflux.  No history of heart or lung disease.  Non-smoker.  Denies alcohol use.  No other complaints.  HPI  Past Medical History:  Diagnosis Date   Hypercholesteremia    Hypertension     There are no problems to display for this patient.   History reviewed. No pertinent surgical history.     Home Medications    Prior to Admission medications   Medication Sig Start Date End Date Taking? Authorizing Provider  famotidine (PEPCID) 20 MG tablet Take 1 tablet (20 mg total) by mouth 2 (two) times daily for 14 days. 12/20/22 01/03/23 Yes Eusebio Friendly B, PA-C  gabapentin (NEURONTIN) 300 MG capsule Take 300 mg by mouth 3 (three) times daily. 12/13/19  Yes [provider]  lisinopril-hydrochlorothiazide (ZESTORETIC) 20-25 MG tablet Take 1 tablet by mouth daily. 04/18/19  Yes [provider]  pantoprazole (PROTONIX) 40 MG tablet Take 1 tablet (40 mg total) by mouth 2 (two) times daily before a meal. 01/17/20  Yes Cook, Jayce G, DO  simvastatin (ZOCOR)  20 MG tablet Take by mouth. 04/18/19  Yes [provider]  sucralfate (CARAFATE) 1 GM/10ML suspension Take 10 mLs (1 g total) by mouth 3 (three) times daily as needed (gerd). 12/20/22  Yes Eusebio Friendly B, PA-C  albuterol (VENTOLIN HFA) 108 (90 Base) MCG/ACT inhaler Inhale into the lungs. 07/09/18   [provider]  ondansetron (ZOFRAN ODT) 4 MG disintegrating tablet Take 1 tablet (4 mg total) by mouth every 8 (eight) hours as needed for nausea or vomiting. 12/08/17   Sharman Cheek, MD    Family History Family History  Problem Relation Age of Onset   Healthy Mother     Social History Social History   Tobacco Use   Smoking status: Never   Smokeless tobacco: Never  Vaping Use   Vaping Use: Never used  Substance Use Topics   Alcohol use: No   Drug use: No     Allergies   Patient has no known allergies.   Review of Systems Review of Systems  Constitutional:  Positive for fatigue. Negative for diaphoresis and fever.  Respiratory:  Negative for chest tightness and shortness of breath.   Cardiovascular:  Positive for chest pain. Negative for palpitations and leg swelling.  Gastrointestinal:  Negative for abdominal pain, diarrhea, nausea and vomiting.  Genitourinary:  Negative for flank pain.  Musculoskeletal:  Negative for back pain and neck pain.  Neurological:  Positive for dizziness. Negative for syncope, weakness, numbness and headaches.     Physical Exam Triage  Vital Signs ED Triage Vitals  Enc Vitals Group     BP 12/20/22 1431 (!) 155/90     Pulse Rate 12/20/22 1431 79     Resp 12/20/22 1431 15     Temp 12/20/22 1431 97.8 F (36.6 C)     Temp Source 12/20/22 1431 Oral     SpO2 12/20/22 1431 99 %     Weight 12/20/22 1430 176 lb 2.4 oz (79.9 kg)     Height 12/20/22 1430 5\' 10"  (1.778 m)     Head Circumference --      Peak Flow --      Pain Score 12/20/22 1430 6     Pain Loc --      Pain Edu? --      Excl. in [place]? --    No data  found.  Updated Vital Signs BP (!) 155/90 (BP Location: Left Arm)   Pulse 79   Temp 97.8 F (36.6 C) (Oral)   Resp 15   Ht 5\' 10"  (1.778 m)   Wt 176 lb 2.4 oz (79.9 kg)   SpO2 99%   BMI 25.27 kg/m    Physical Exam Vitals and nursing note reviewed.  Constitutional:      General: He is not in acute distress.    Appearance: Normal appearance. He is well-developed. He is not ill-appearing.  HENT:     Head: Normocephalic and atraumatic.     Nose: Nose normal.     Mouth/Throat:     Mouth: Mucous membranes are moist.     Pharynx: Oropharynx is clear.  Eyes:     General: No scleral icterus.    Conjunctiva/sclera: Conjunctivae normal.  Cardiovascular:     Rate and Rhythm: Normal rate and regular rhythm.     Heart sounds: Normal heart sounds.  Pulmonary:     Effort: Pulmonary effort is normal. No respiratory distress.     Breath sounds: Normal breath sounds.  Abdominal:     Palpations: Abdomen is soft.     Tenderness: There is no abdominal tenderness.  Musculoskeletal:     Cervical back: Neck supple.  Skin:    General: Skin is warm and dry.     Capillary Refill: Capillary refill takes less than 2 seconds.  Neurological:     General: No focal deficit present.     Mental Status: He is alert. Mental status is at baseline.     Motor: No weakness.     Gait: Gait normal.  Psychiatric:        Mood and Affect: Mood normal.        Behavior: Behavior normal.      UC Treatments / Results  Labs (all labs ordered are listed, but only abnormal results are displayed) Labs Reviewed  GLUCOSE, CAPILLARY - Abnormal; Notable for the following components:      Result Value   Glucose-Capillary 136 (*)    All other components within normal limits  CBC WITH DIFFERENTIAL/PLATELET - Abnormal; Notable for the following components:   MCHC 36.7 (*)    Platelets 148 (*)    All other components within normal limits  COMPREHENSIVE METABOLIC PANEL - Abnormal; Notable for the following  components:   Sodium 134 (*)    Glucose, Bld 139 (*)    All other components within normal limits  LIPASE, BLOOD  CBG MONITORING, ED  TROPONIN I (HIGH SENSITIVITY)    EKG   Radiology No results found.  Procedures ED EKG  Date/Time: 12/20/2022 2:51  PM  Performed by: Shirlee Latch, PA-C Authorized by: Shirlee Latch, PA-C   Previous ECG:    Previous ECG:  Compared to current   Similarity:  No change Interpretation:    Interpretation: abnormal   Rate:    ECG rate:  73 Rhythm:    Rhythm: sinus rhythm   Ectopy:    Ectopy: none   QRS:    QRS axis:  Normal   QRS intervals:  Normal   QRS conduction: normal   ST segments:    ST segments:  Non-specific Comments:     Normal sinus rhythm. Non specific ST abnormality   (including critical care time)  Medications Ordered in UC Medications  alum & mag hydroxide-simeth (MAALOX/MYLANTA) 200-200-20 MG/5ML suspension 30 mL (30 mLs Oral Given 12/20/22 1454)  lidocaine (XYLOCAINE) 2 % viscous mouth solution 15 mL (15 mLs Mouth/Throat Given 12/20/22 1454)    Initial Impression / Assessment and Plan / UC Course  I have reviewed the triage vital signs and the nursing notes.  Pertinent labs & imaging results that were available during my care of the patient were reviewed by me and considered in my medical decision making (see chart for details).   72 year old male with history of hypertension, hyperlipidemia, prediabetes and GERD presents for 7 to 8-hour history of the central chest discomfort which is intermittent.  Associated with fatigue and dizziness.  Has taken pantoprazole 20 mg today without relief.  No other medications tried.  No history of heart disease or lung disease.  Symptoms have not gotten any better or worse from onset.  No associated palpitations, shortness of breath, weakness, presyncope/syncope.  Blood pressure 155/90.  Other vitals normal and stable.  EKG performed today shows normal sinus rhythm and regular rate.   Nonspecific ST abnormality.  This compared to EKG from 2022 without any obvious abnormalities.  POC glucose is 136.  Discussed going to emergency department for further workup including troponins to rule out NSTEMI.  Patient would like to hold off on going to the ED at this time.  I have agreed to give him a GI cocktail to see if that would help and perform some labs including CBC, CMP and troponin.  CBC and CMP without any significant abnormalities.  Troponin is negative.  Lipase is normal.  Reviewed these results with patient.  He reported resolution of his symptoms after the GI cocktail.  Patient is taking pantoprazole 40 mg a day.  Will add famotidine and Carafate at this time and have him make sure to avoid triggers.  Reviewed going to the ED for any acute worsening of the chest pain or associated red flag signs and symptoms which I outlined in his discharge paperwork.   Final Clinical Impressions(s) / UC Diagnoses   Final diagnoses:  Chest pain, unspecified type  Gastroesophageal reflux disease, unspecified whether esophagitis present     Discharge Instructions      -No changes to your EKG compared to old EKG and your heart enzyme was normal so I have low suspicion for cardiac problem such as heart attack. - Your other labs looked good as well. - Since you have had relief with the GI cocktail I am confident your symptoms are related to flareup of your acid reflux.  Continue taking the Protonix.  I added 2 more antacids to the list.  Increase your fluids and avoid known triggers. - If your chest pain returns and worsens or is associated with any radiating symptoms to the neck,  arm, back, numbness/tingling, vomiting, dizziness which worsens, sweats, palpitations, etc. then please call 911 or go to the ER.     ED Prescriptions     Medication Sig Dispense Auth. Provider   famotidine (PEPCID) 20 MG tablet Take 1 tablet (20 mg total) by mouth 2 (two) times daily for 14 days. 28  tablet Eusebio Friendly B, PA-C   sucralfate (CARAFATE) 1 GM/10ML suspension Take 10 mLs (1 g total) by mouth 3 (three) times daily as needed (gerd). 420 mL Shirlee Latch, PA-C      PDMP not reviewed this encounter.   Shirlee Latch, PA-C 12/20/22 1537

## 2022-12-20 NOTE — Discharge Instructions (Addendum)
-  No changes to your EKG compared to old EKG and your heart enzyme was normal so I have low suspicion for cardiac problem such as heart attack. - Your other labs looked good as well. - Since you have had relief with the GI cocktail I am confident your symptoms are related to flareup of your acid reflux.  Continue taking the Protonix.  I added 2 more antacids to the list.  Increase your fluids and avoid known triggers. - If your chest pain returns and worsens or is associated with any radiating symptoms to the neck, arm, back, numbness/tingling, vomiting, dizziness which worsens, sweats, palpitations, etc. then please call 911 or go to the ER.

## 2022-12-20 NOTE — ED Triage Notes (Signed)
Patient epigastric chest pain and indigestion that started early this morning around 7am.  Patient reports he started feeling dizzy around 12 pm today.  Patient denies N/V.  Patient denies pain going into his arms.  Patient reports bumping a lot.

## 2022-12-21 ENCOUNTER — Emergency Department: Payer: Medicare HMO

## 2022-12-21 ENCOUNTER — Telehealth: Payer: Self-pay | Admitting: Physician Assistant

## 2022-12-21 ENCOUNTER — Other Ambulatory Visit: Payer: Self-pay

## 2022-12-21 ENCOUNTER — Emergency Department
Admission: EM | Admit: 2022-12-21 | Discharge: 2022-12-21 | Disposition: A | Discharge status: Home / Self Care | Payer: Medicare HMO | Attending: Emergency Medicine | Admitting: Emergency Medicine

## 2022-12-21 DIAGNOSIS — I10 Essential (primary) hypertension: Secondary | ICD-10-CM | POA: Diagnosis not present

## 2022-12-21 DIAGNOSIS — R42 Dizziness and giddiness: Secondary | ICD-10-CM | POA: Insufficient documentation

## 2022-12-21 DIAGNOSIS — R0789 Other chest pain: Secondary | ICD-10-CM | POA: Insufficient documentation

## 2022-12-21 LAB — HEPATIC FUNCTION PANEL
ALT: 28 U/L (ref 0–44)
AST: 31 U/L (ref 15–41)
Albumin: 4.3 g/dL (ref 3.5–5.0)
Alkaline Phosphatase: 49 U/L (ref 38–126)
Bilirubin, Direct: 0.2 mg/dL (ref 0.0–0.2)
Indirect Bilirubin: 0.8 mg/dL (ref 0.3–0.9)
Total Bilirubin: 1 mg/dL (ref 0.3–1.2)
Total Protein: 7.2 g/dL (ref 6.5–8.1)

## 2022-12-21 LAB — BASIC METABOLIC PANEL
Anion gap: 12 (ref 5–15)
BUN: 13 mg/dL (ref 8–23)
CO2: 24 mmol/L (ref 22–32)
Calcium: 9.3 mg/dL (ref 8.9–10.3)
Chloride: 98 mmol/L (ref 98–111)
Creatinine, Ser: 0.81 mg/dL (ref 0.61–1.24)
GFR, Estimated: >60 mL/min (ref >60)
Glucose, Bld: 145 mg/dL — ABNORMAL HIGH (ref 70–99)
Potassium: 3.4 mmol/L — ABNORMAL LOW (ref 3.5–5.1)
Sodium: 134 mmol/L — ABNORMAL LOW (ref 135–145)

## 2022-12-21 LAB — LIPASE, BLOOD: Lipase: 40 U/L (ref 11–51)

## 2022-12-21 LAB — CBC
HCT: 42.2 % (ref 39.0–52.0)
Hemoglobin: 15.3 g/dL (ref 13.0–17.0)
MCH: 30.2 pg (ref 26.0–34.0)
MCHC: 36.3 g/dL — ABNORMAL HIGH (ref 30.0–36.0)
MCV: 83.4 fL (ref 80.0–100.0)
Platelets: 164 10*3/uL (ref 150–400)
RBC: 5.06 MIL/uL (ref 4.22–5.81)
RDW: 13.3 % (ref 11.5–15.5)
WBC: 6.6 10*3/uL (ref 4.0–10.5)
nRBC: 0 % (ref 0.0–0.2)

## 2022-12-21 LAB — TROPONIN I (HIGH SENSITIVITY)
Troponin I (High Sensitivity): 3 ng/L (ref <18)
Troponin I (High Sensitivity): 3 ng/L (ref <18)

## 2022-12-21 MED ORDER — MECLIZINE HCL 25 MG PO TABS
25.0000 mg | ORAL_TABLET | Freq: Once | ORAL | Status: AC
  Administered 2022-12-21: 25 mg via ORAL
  Filled 2022-12-21: qty 1

## 2022-12-21 MED ORDER — SODIUM CHLORIDE 0.9 % IV BOLUS
500.0000 mL | Freq: Once | INTRAVENOUS | Status: AC
  Administered 2022-12-21: 500 mL via INTRAVENOUS

## 2022-12-21 MED ORDER — FAMOTIDINE IN NACL 20-0.9 MG/50ML-% IV SOLN
20.0000 mg | Freq: Once | INTRAVENOUS | Status: AC
  Administered 2022-12-21: 20 mg via INTRAVENOUS
  Filled 2022-12-21: qty 50

## 2022-12-21 MED ORDER — MECLIZINE HCL 25 MG PO TABS: 25.0000 mg | ORAL_TABLET | Freq: Three times a day (TID) | ORAL | 0 refills | Status: AC | PRN

## 2022-12-21 NOTE — Telephone Encounter (Signed)
Seen in urgent care yesterday for chest discomfort and dizziness.  Patient had resolution of symptoms before leaving the urgent care and EKG and troponin were reassuring.  Symptoms thought to be due to GERD.  Spoke with patient's granddaughter who stated that his dizziness had gotten much worse today.  I discussed taking him immediately to the emergency department at this time for further workup like we discussed yesterday.  She is taking him to the ER and he is currently in stable condition.

## 2022-12-21 NOTE — ED Provider Notes (Signed)
[facility] Provider Note    Event Date/Time   First MD Initiated Contact with Patient 12/21/22 1555     (approximate)   History   Chest Pain and Dizziness   HPI  Gregory Mathis is a 72 y.o. male with a history of hypertension, hyperlipidemia, prediabetes, and GERD who presents with central chest pain since yesterday, persistent course, mainly in the middle of his chest, nonradiating, associated with dizziness which she describes as a sensation of feeling like he is going to fall.  He states he has had indigestion before that felt like this but without the dizziness.  He denies any nausea or vomiting.  He has no difficulty breathing.  He has had no change in his bowel movements.  He went to urgent care yesterday and received some medication which temporarily relieve the symptoms but then they got worse later in the evening.  He states that the symptoms started after he ate a heavy meal including a barbecue sandwich and some hot dogs yesterday.  I reviewed the past medical records.  I can from that the patient was seen at [facility] urgent care yesterday with this chest pain.  The patient is on pantoprazole normally.  He received Maalox and lidocaine, and then famotidine and Carafate were prescribed.   Physical Exam   Triage Vital Signs: ED Triage Vitals  Enc Vitals Group     BP 12/21/22 1552 (!) 152/79     Pulse Rate 12/21/22 1552 69     Resp 12/21/22 1552 18     Temp 12/21/22 1552 97.8 F (36.6 C)     Temp Source 12/21/22 1552 Oral     SpO2 12/21/22 1552 96 %     Weight 12/21/22 1547 172 lb (78 kg)     Height 12/21/22 1547 5\' 9"  (1.753 m)     Head Circumference --      Peak Flow --      Pain Score 12/21/22 1547 6     Pain Loc --      Pain Edu? --      Excl. in [place]? --     Most recent vital signs: Vitals:   12/21/22 1900 12/21/22 1930  BP: (!) 128/114 129/69  Pulse: 64 62  Resp: 15 (!) 23  Temp:    SpO2: 96% 98%    General: Awake, no distress.   CV:  Good peripheral perfusion.  Normal heart sounds. Resp:  Normal effort.  Lungs CTAB. Abd:  No distention.  Other:  No calf or popliteal swelling or tenderness.   ED Results / Procedures / Treatments   Labs (all labs ordered are listed, but only abnormal results are displayed) Labs Reviewed  BASIC METABOLIC PANEL - Abnormal; Notable for the following components:      Result Value   Sodium 134 (*)    Potassium 3.4 (*)    Glucose, Bld 145 (*)    All other components within normal limits  CBC - Abnormal; Notable for the following components:   MCHC 36.3 (*)    All other components within normal limits  LIPASE, BLOOD  HEPATIC FUNCTION PANEL  TROPONIN I (HIGH SENSITIVITY)  TROPONIN I (HIGH SENSITIVITY)     EKG  ED ECG REPORT I, Dionne Bucy, the attending physician, personally viewed and interpreted this ECG.  Date: 12/21/2022 EKG Time: 1550 Rate: 69 Rhythm: normal sinus rhythm QRS Axis: normal Intervals: normal ST/T Wave abnormalities: normal Narrative Interpretation: no evidence of acute ischemia  RADIOLOGY  Chest x-ray: I independently viewed and interpreted the images; there is no focal consolidation or edema  PROCEDURES:  Critical Care performed: No  Procedures   MEDICATIONS ORDERED IN ED: Medications  sodium chloride 0.9 % bolus 500 mL (0 mLs Intravenous Stopped 12/21/22 1748)  famotidine (PEPCID) IVPB 20 mg premix (0 mg Intravenous Stopped 12/21/22 1748)  meclizine (ANTIVERT) tablet 25 mg (25 mg Oral Given 12/21/22 1935)     IMPRESSION / MDM / ASSESSMENT AND PLAN / ED COURSE  I reviewed the triage vital signs and the nursing notes.  72 year old male with PMH as noted above presents with central chest pain since yesterday after eating a heavy meal and associated with some dizziness.  On exam the vital signs are normal.  The patient is well-appearing.  Exam is otherwise unremarkable.  EKG is nonischemic.  Differential diagnosis includes,  but is not limited to, GERD, gastritis, PUD, musculoskeletal pain, less likely ACS.  I do not suspect PE given that the symptoms got better with a GI cocktail yesterday and the patient has no tachycardia or hypoxia.  There is no evidence of aortic dissection or other vascular cause.  We will obtain chest x-ray, lab workup, give IV Pepcid, and reassess.  Patient's presentation is most consistent with acute presentation with potential threat to life or bodily function.  The patient is on the cardiac monitor to evaluate for evidence of arrhythmia and/or significant heart rate changes.  ----------------------------------------- 8:32 PM on 12/21/2022 -----------------------------------------  Troponins are negative x 2.  The chest pain has resolved.  BMP, LFTs, and lipase are all within normal limits.  The patient reported some persistent dizziness and described it more clearly as as a vertigo type movement or spinning sensation.  I gave a dose of meclizine and the symptom has completely resolved.  The patient feels well and would like to go home.  He is stable for discharge at this time.  I counseled him on the results of the workup.  I gave strict return precautions and he expressed understanding.  FINAL CLINICAL IMPRESSION(S) / ED DIAGNOSES   Final diagnoses:  Vertigo  Atypical chest pain     Rx / DC Orders   ED Discharge Orders          Ordered    meclizine (ANTIVERT) 25 MG tablet  3 times daily PRN        12/21/22 2031             Note:  This document was prepared using Dragon voice recognition software and may include unintentional dictation errors.    Dionne Bucy, MD 12/21/22 2032

## 2022-12-21 NOTE — ED Triage Notes (Signed)
Pt reports indigestion and dizziness since yesterday. Reports indigestion pain is to mid chest and non radiating. Reports it is difficult to explain the pain. Pt reports went to UC yesterday, they gave him some liquid medication for acid indigestion and was told if it got worse to go to the ED. Pt reports the meds they gave him made his pain worse.

## 2022-12-21 NOTE — ED Triage Notes (Signed)
Pt called for triage, no response. 

## 2022-12-21 NOTE — Discharge Instructions (Signed)
Take the meclizine as needed for vertigo.  Return to the ER for new, worsening, or persistent severe chest pain, difficulty breathing, dizziness, weakness, or any other new or worsening symptoms that concern you.

## 2023-06-23 IMAGING — CR DG CHEST 2V
2 series · 2 of 2 positions shown · non-contrast
Comparison: 01/04/2017

CLINICAL DATA: Chest pain

EXAM:
CHEST - 2 VIEW

[chest lat]
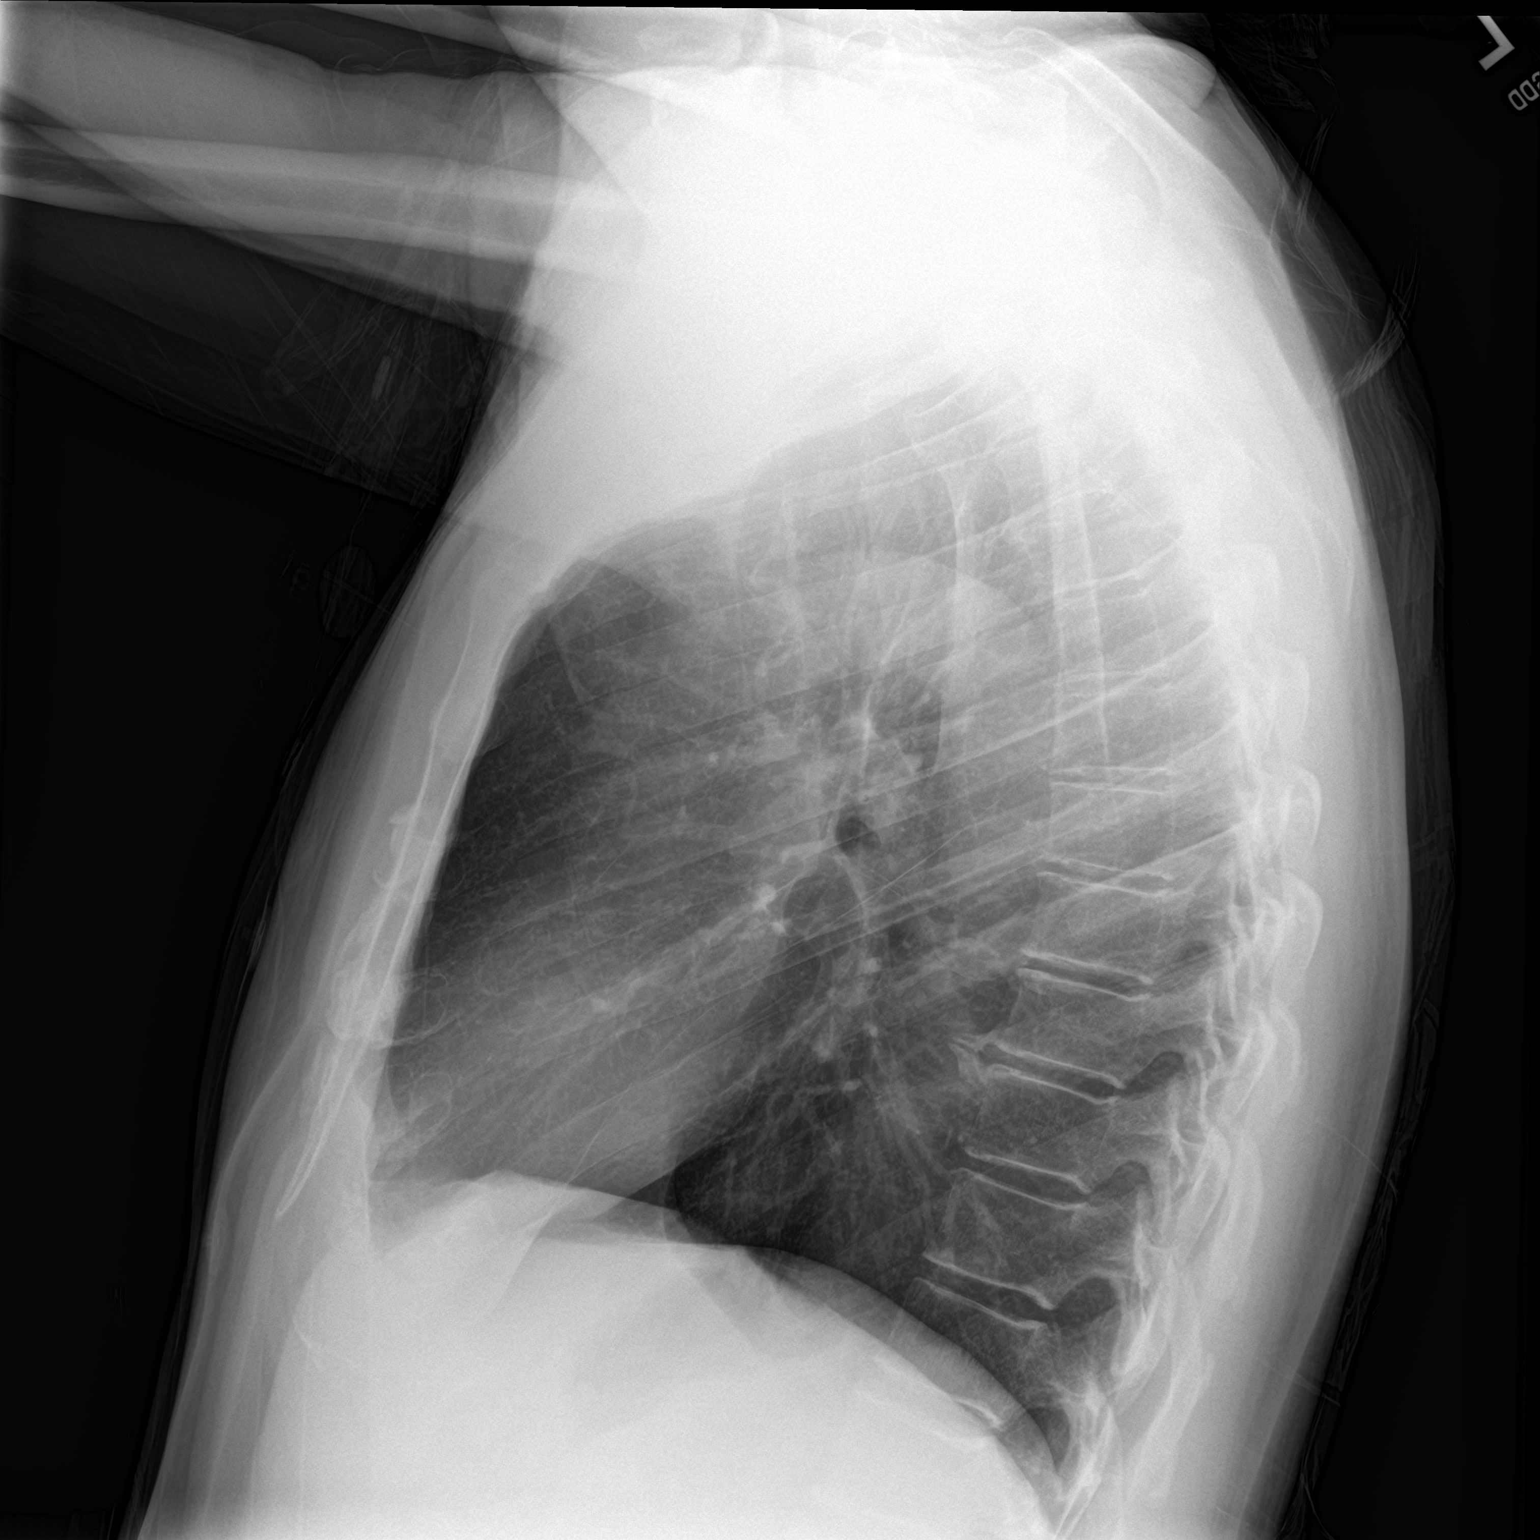

[chest pa]
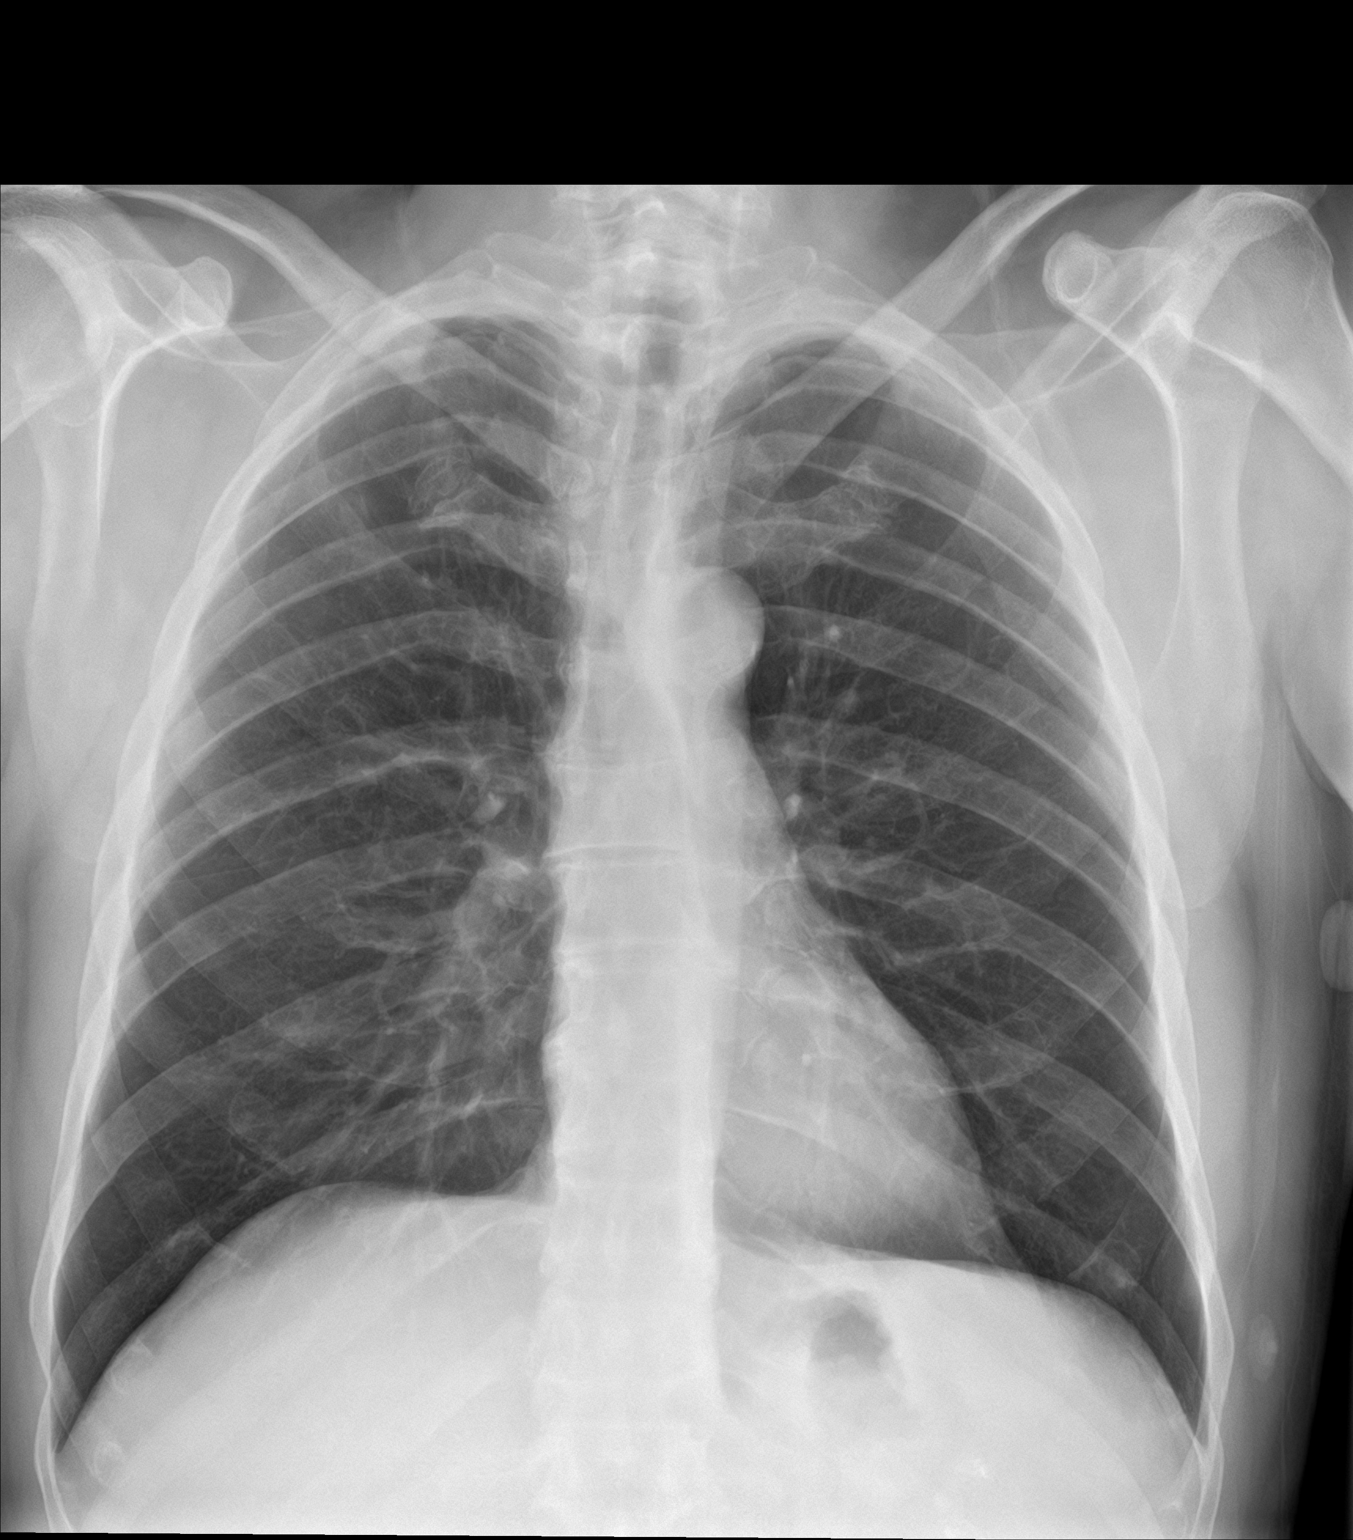

[2 of 2 positions shown; findings below may reference images not displayed]

FINDINGS: The heart size and mediastinal contours are within normal limits.
Both lungs are clear. The visualized skeletal structures are
unremarkable.
IMPRESSION: No active cardiopulmonary disease.
